# Patient Record
Sex: Male | Born: 1935 | Race: White | Hispanic: No | Smoking: Former smoker
Health system: Southern US, Community
[De-identification: ages and names within clinical notes are randomized; demographics above are authoritative.]

## PROBLEM LIST (undated history)

## (undated) DIAGNOSIS — I493 Ventricular premature depolarization: Secondary | ICD-10-CM

## (undated) DIAGNOSIS — I10 Essential (primary) hypertension: Secondary | ICD-10-CM

## (undated) DIAGNOSIS — E119 Type 2 diabetes mellitus without complications: Secondary | ICD-10-CM

## (undated) DIAGNOSIS — M1990 Unspecified osteoarthritis, unspecified site: Secondary | ICD-10-CM

## (undated) DIAGNOSIS — D649 Anemia, unspecified: Secondary | ICD-10-CM

## (undated) DIAGNOSIS — H9190 Unspecified hearing loss, unspecified ear: Secondary | ICD-10-CM

## (undated) DIAGNOSIS — I351 Nonrheumatic aortic (valve) insufficiency: Secondary | ICD-10-CM

## (undated) HISTORY — PX: BACK SURGERY: SHX140

## (undated) HISTORY — PX: NO PAST SURGERIES: SHX2092

## (undated) HISTORY — PX: COLONOSCOPY: SHX174

## (undated) HISTORY — DX: Nonrheumatic aortic (valve) insufficiency: I35.1

## (undated) HISTORY — PX: CATARACT EXTRACTION W/ INTRAOCULAR LENS IMPLANT: SHX1309

## (undated) HISTORY — DX: Ventricular premature depolarization: I49.3

---

## 1997-12-23 ENCOUNTER — Other Ambulatory Visit: Admission: RE | Admit: 1997-12-23 | Discharge: 1997-12-23 | Payer: Self-pay | Admitting: *Deleted

## 2000-03-18 ENCOUNTER — Encounter (INDEPENDENT_AMBULATORY_CARE_PROVIDER_SITE_OTHER): Payer: Self-pay | Admitting: Specialist

## 2000-03-18 ENCOUNTER — Other Ambulatory Visit: Admission: RE | Admit: 2000-03-18 | Discharge: 2000-03-18 | Payer: Self-pay | Admitting: Gastroenterology

## 2005-05-31 ENCOUNTER — Encounter: Admission: RE | Admit: 2005-05-31 | Discharge: 2005-05-31 | Payer: Self-pay | Admitting: Orthopedic Surgery

## 2005-06-19 ENCOUNTER — Encounter: Admission: RE | Admit: 2005-06-19 | Discharge: 2005-06-19 | Payer: Self-pay | Admitting: Orthopedic Surgery

## 2005-07-10 ENCOUNTER — Encounter: Admission: RE | Admit: 2005-07-10 | Discharge: 2005-07-10 | Payer: Self-pay | Admitting: Neurosurgery

## 2006-04-16 IMAGING — CT CT L SPINE W/ CM
2 of 8 series · 12 of 27 positions shown, 14 images · IV contrast (omnipaque)
Comparison: none

CLINICAL DATA: Low back and left thigh pain. Previous lumbar surgery 5440.

LUMBAR MYELOGRAM:
CT LUMBAR SPINE WITH INTRATHECAL CONTRAST:
TECHNIQUE: An appropriate entry site was determined under fluoroscopy. Skin site
was marked, prepped with Betadine, and draped in usual sterile fashion, and
infiltrated locally with 1% lidocaine. A 22 gauge spinal needle was  advanced
into the thecal sac at L3-L4 from a right interlaminar approach . Clear
colorless CSF returned. 17 ml Omnipaque 180 were administered intrathecally for
lumbar myelography, followed by axial CT scanning of the lumbar spine. Coronal
and sagittal reconstructions were generated from the axial scan.

[Series 4: recon 3: l-spine helical · axial · 0.27mm/px · z∈[-77,+67]mm · 7 of 155 slices shown, 9 images]
[im 20/155  soft-tissue]
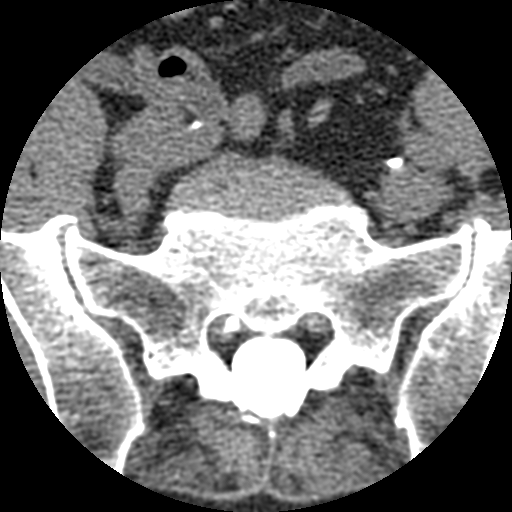
[im 20/155  bone]
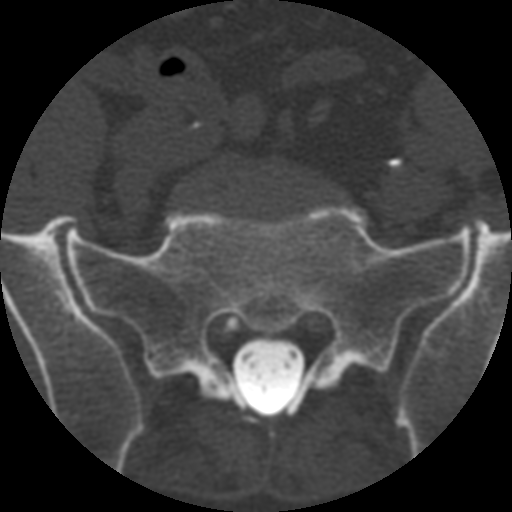
[im 39/155  bone]
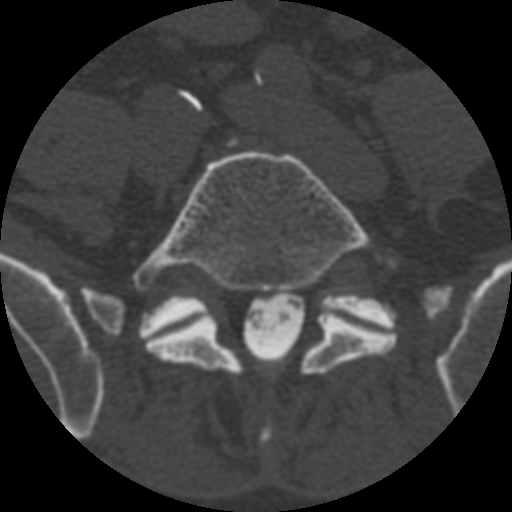
[im 58/155  bone]
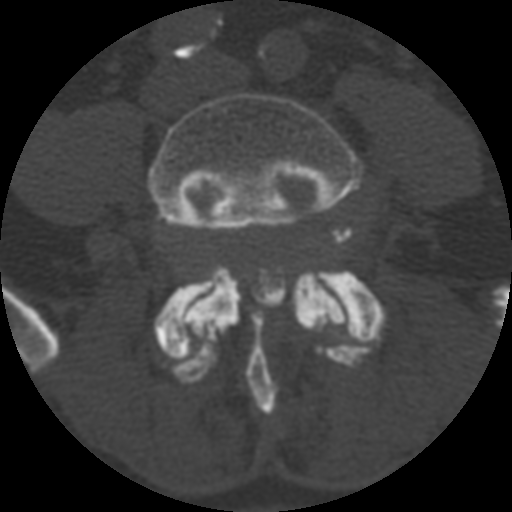
[im 78/155  bone]
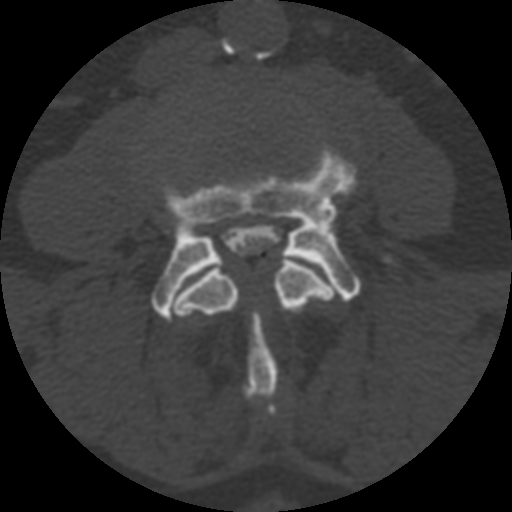
[im 97/155  soft-tissue]
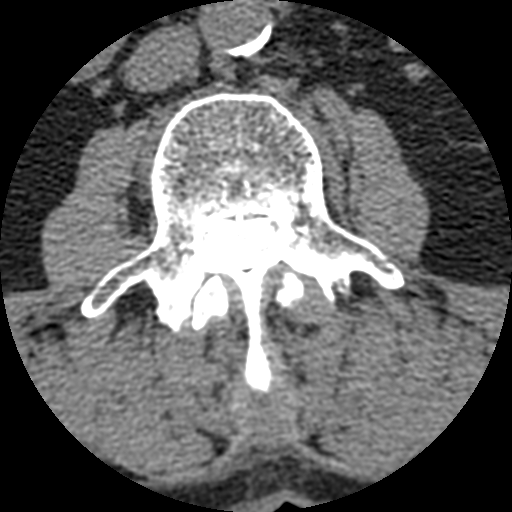
[im 97/155  bone]
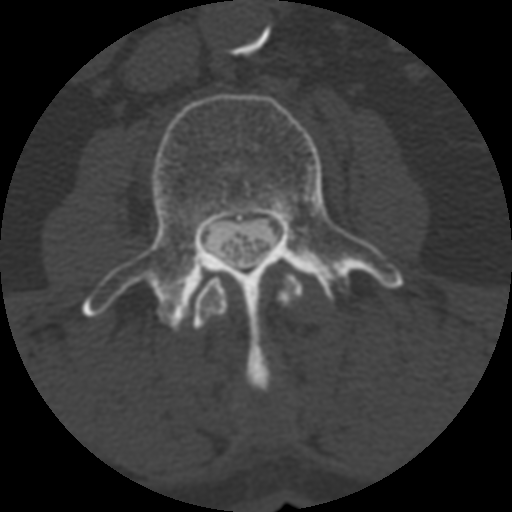
[im 116/155  bone]
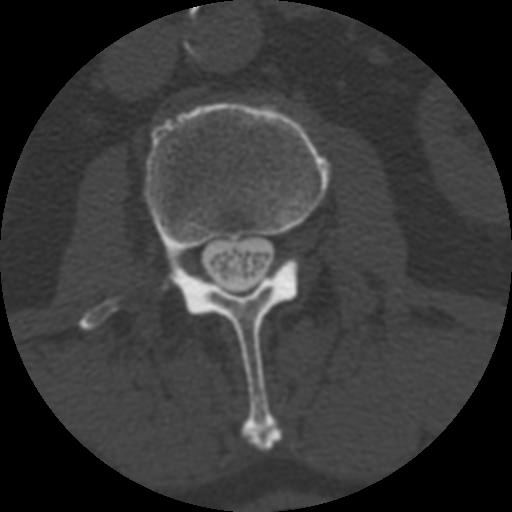
[im 135/155  bone]
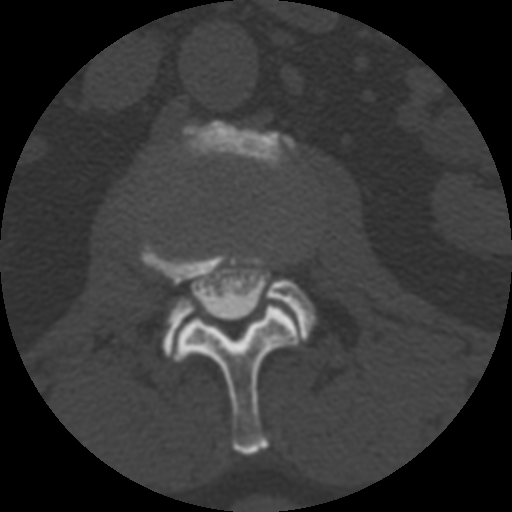

[Series 400: reformatted · sagittal · 0.39mm/px · 5 of 40 slices shown]
[im 7/40  bone]
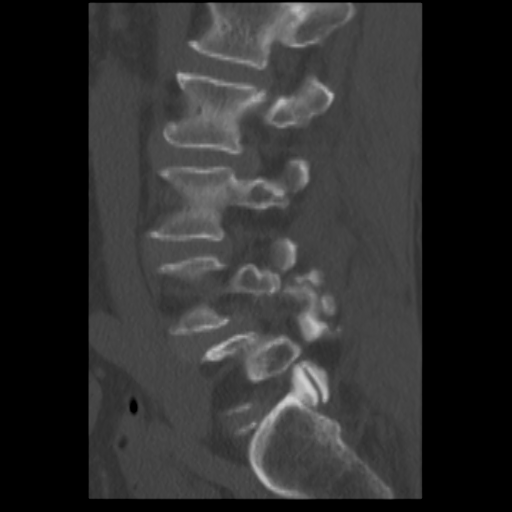
[im 14/40  bone]
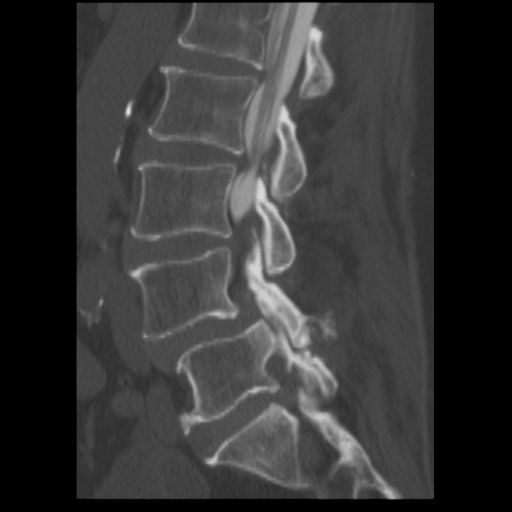
[im 20/40  bone]
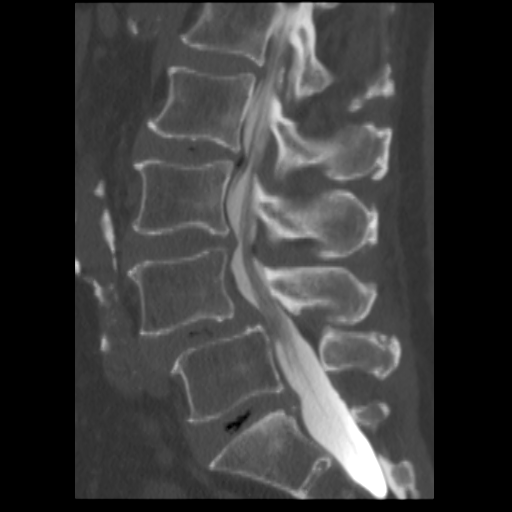
[im 27/40  bone]
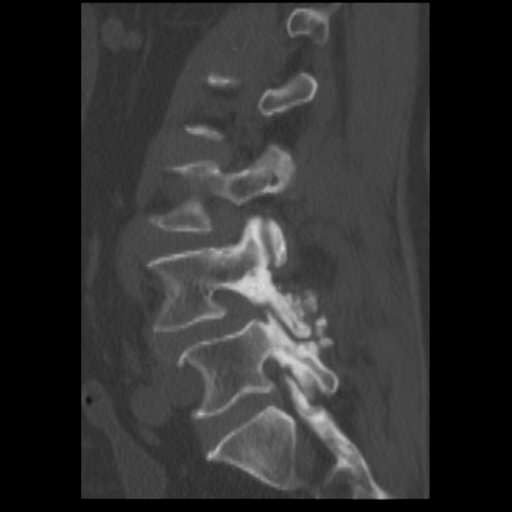
[im 33/40  bone]
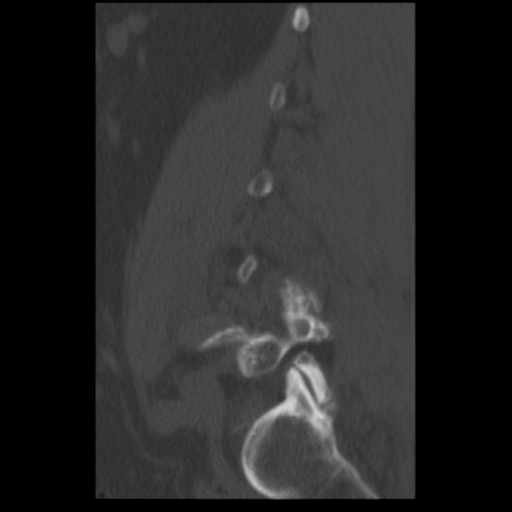

[12 of 27 positions shown; findings below may reference images not displayed]

No previous available for comparison.

L1-L2: Mild circumferential disc bulge minimally indenting the anterior aspect
of the thecal sac. Neuroforamina remain widely patent. Minimal bilateral facet
degenerative change.

L2-L3: Mild narrowed interspace with vacuum phenomenon. There is a broad
posterior disc bulge, containing small gas bubble just to the left of midline.
There  is a prominent right extra foraminal protrusion. There is mild bilateral
facet degenerative hypertrophy and some early thickening of ligamentum flavum
contributing to mild spinal stenosis.

L3-L4: Early vacuum phenomenon in the interspace. Moderate circumferential disc
bulge. Bilateral facet   mild degenerative hypertrophy with some thickening of
ligamentum flavum contributing to mild spinal stenosis and bilateral
subarticular recess stenosis. There is a left foraminal protrusion which extends
cephalad behind the L3 vertebral body. This effaces the neural foramina.

L4-L5: Advanced bilateral facet degenerative hypertrophy. This allows grade 2
anterolisthesis. Soft tissue attenuation material extends from the level of the
interspace into the foramina bilaterally, left greater than right. There is
marked thickening of the ligamentum flavum contributing to moderate spinal
stenosis. There is significant subarticular recess encroachment.

L5-S1: There is central vacuum phenomenon. There is a partially calcified right
paracentral broad protrusion which contacts the right S1 nerve root sleeve
proximally. No significant spinal stenosis. Minimal bilateral facet degenerative
hypertrophy.

Standing flexion and extension radiographs show no dynamic instability.
IMPRESSION: 1. Disc bulge L2-L3 with superimposed right extra foraminal protrusion.
2. Left foraminal protrusion   L3-4 which may affect the exiting L3 nerve root.
3. Grade 2 anterolisthesis L4-L5 secondary to facet disease, with resultant
moderate spinal stenosis, bilateral neural foraminal narrowing, and subarticular
recess encroachment.
4. Right paracentral protrusion L5-S1

## 2008-08-26 ENCOUNTER — Encounter: Admission: RE | Admit: 2008-08-26 | Discharge: 2008-08-26 | Payer: Self-pay | Admitting: Internal Medicine

## 2008-12-06 ENCOUNTER — Encounter: Admission: RE | Admit: 2008-12-06 | Discharge: 2008-12-06 | Payer: Self-pay | Admitting: Internal Medicine

## 2010-07-22 ENCOUNTER — Encounter: Admission: RE | Admit: 2010-07-22 | Discharge: 2010-07-22 | Payer: Self-pay | Admitting: Orthopedic Surgery

## 2010-08-15 ENCOUNTER — Inpatient Hospital Stay (HOSPITAL_COMMUNITY)
Admission: RE | Admit: 2010-08-15 | Discharge: 2010-08-16 | Payer: Self-pay | Source: Home / Self Care | Attending: Neurosurgery | Admitting: Neurosurgery

## 2010-08-16 ENCOUNTER — Inpatient Hospital Stay (HOSPITAL_COMMUNITY)
Admission: EM | Admit: 2010-08-16 | Discharge: 2010-08-19 | Payer: Self-pay | Source: Home / Self Care | Attending: Neurosurgery | Admitting: Neurosurgery

## 2010-09-04 ENCOUNTER — Encounter
Admission: RE | Admit: 2010-09-04 | Discharge: 2010-09-04 | Payer: Self-pay | Source: Home / Self Care | Attending: Neurosurgery | Admitting: Neurosurgery

## 2010-09-16 ENCOUNTER — Encounter: Payer: Self-pay | Admitting: Orthopedic Surgery

## 2010-11-04 NOTE — Discharge Summary (Signed)
  NAMESAMANYU, [name]                  ACCOUNT NO.:  [id number]  MEDICAL RECORD NO.:  [id number]          PATIENT TYPE:  INP  LOCATION:  3536                         FACILITY:  [facility]  PHYSICIAN:  Reinaldo Meeker, M.D. DATE OF BIRTH:  Jan 23, 1931  DATE OF ADMISSION:  08/16/2010 DATE OF DISCHARGE:  08/19/2010                              DISCHARGE SUMMARY   PRIMARY DIAGNOSES:  Status post anterior cervical discectomy with fusion and plating with postoperative seroma.  PRIMARY OPERATIVE PROCEDURE:  Anterior ablation of wound.  HISTORY:  [name] is a 77 year old gentleman who underwent a C3-C4 and C6-7 anterior cervical diskectomy few days prior to admission.  He was discharged home.  On the day re-admission, he came back with some difficulty swallowing.  Dr. Yetta Barre was attending to him that evening, chose to take him to the operating room, opened up the C3-C4 incision and evacuated, which was felt to be hematoma.  He did not actually find much hematoma but found more of a fluid collection consistent with seroma.  He, therefore, evacuated that, left a drain in place.  Over the subsequent days, the patient was able to increase his activity. Swelling is much better.  His voice is much better.  His wound looked good.  The patient, therefore, was kept until December 24.  We elected to remove the drain at that time and have him return to the office for followup in a few days.  His condition was markedly improved versus admission.          ______________________________ Reinaldo Meeker, M.D.     ROK/MEDQ  D:  10/15/2010  T:  10/16/2010  Job:  [id number]  Electronically Signed by Aliene Beams M.D. on 11/04/2010 10:21:51 AM

## 2010-11-06 LAB — DIFFERENTIAL
Basophils Absolute: 0 10*3/uL (ref 0.0–0.1)
Basophils Relative: 0 % (ref 0–1)
Eosinophils Absolute: 0 10*3/uL (ref 0.0–0.7)
Eosinophils Relative: 0 % (ref 0–5)
Lymphocytes Relative: 14 % (ref 12–46)
Lymphs Abs: 1.5 10*3/uL (ref 0.7–4.0)
Monocytes Absolute: 1.2 10*3/uL — ABNORMAL HIGH (ref 0.1–1.0)
Monocytes Relative: 11 % (ref 3–12)
Neutro Abs: 8.1 10*3/uL — ABNORMAL HIGH (ref 1.7–7.7)
Neutrophils Relative %: 75 % (ref 43–77)

## 2010-11-06 LAB — CBC
HCT: 44.4 % (ref 39.0–52.0)
HCT: 47.3 % (ref 39.0–52.0)
Hemoglobin: 14.9 g/dL (ref 13.0–17.0)
Hemoglobin: 15.5 g/dL (ref 13.0–17.0)
MCH: 31 pg (ref 26.0–34.0)
MCH: 31.1 pg (ref 26.0–34.0)
MCHC: 32.8 g/dL (ref 30.0–36.0)
MCHC: 33.6 g/dL (ref 30.0–36.0)
MCV: 92.5 fL (ref 78.0–100.0)
MCV: 94.8 fL (ref 78.0–100.0)
Platelets: 170 10*3/uL (ref 150–400)
Platelets: 171 10*3/uL (ref 150–400)
RBC: 4.8 MIL/uL (ref 4.22–5.81)
RBC: 4.99 MIL/uL (ref 4.22–5.81)
RDW: 13.5 % (ref 11.5–15.5)
RDW: 13.7 % (ref 11.5–15.5)
WBC: 10.8 10*3/uL — ABNORMAL HIGH (ref 4.0–10.5)
WBC: 5.6 10*3/uL (ref 4.0–10.5)

## 2010-11-06 LAB — GLUCOSE, CAPILLARY
Glucose-Capillary: 119 mg/dL — ABNORMAL HIGH (ref 70–99)
Glucose-Capillary: 119 mg/dL — ABNORMAL HIGH (ref 70–99)
Glucose-Capillary: 126 mg/dL — ABNORMAL HIGH (ref 70–99)
Glucose-Capillary: 139 mg/dL — ABNORMAL HIGH (ref 70–99)
Glucose-Capillary: 140 mg/dL — ABNORMAL HIGH (ref 70–99)
Glucose-Capillary: 147 mg/dL — ABNORMAL HIGH (ref 70–99)
Glucose-Capillary: 147 mg/dL — ABNORMAL HIGH (ref 70–99)
Glucose-Capillary: 149 mg/dL — ABNORMAL HIGH (ref 70–99)
Glucose-Capillary: 168 mg/dL — ABNORMAL HIGH (ref 70–99)
Glucose-Capillary: 182 mg/dL — ABNORMAL HIGH (ref 70–99)
Glucose-Capillary: 183 mg/dL — ABNORMAL HIGH (ref 70–99)
Glucose-Capillary: 199 mg/dL — ABNORMAL HIGH (ref 70–99)
Glucose-Capillary: 224 mg/dL — ABNORMAL HIGH (ref 70–99)

## 2010-11-06 LAB — BASIC METABOLIC PANEL
BUN: 16 mg/dL (ref 6–23)
CO2: 31 mEq/L (ref 19–32)
Calcium: 9.9 mg/dL (ref 8.4–10.5)
Chloride: 104 mEq/L (ref 96–112)
Creatinine, Ser: 0.8 mg/dL (ref 0.4–1.5)
GFR calc Af Amer: >60 mL/min (ref >60)
GFR calc non Af Amer: >60 mL/min (ref >60)
Glucose, Bld: 182 mg/dL — ABNORMAL HIGH (ref 70–99)
Potassium: 4.3 mEq/L (ref 3.5–5.1)
Sodium: 139 mEq/L (ref 135–145)

## 2010-11-06 LAB — POCT I-STAT, CHEM 8
Calcium, Ion: 1.06 mmol/L — ABNORMAL LOW (ref 1.12–1.32)
Chloride: 103 mEq/L (ref 96–112)
HCT: 45 % (ref 39.0–52.0)
Hemoglobin: 15.3 g/dL (ref 13.0–17.0)
Potassium: 3.7 mEq/L (ref 3.5–5.1)

## 2010-11-06 LAB — PROTIME-INR
INR: 1 (ref 0.00–1.49)
Prothrombin Time: 13.4 seconds (ref 11.6–15.2)

## 2010-11-06 LAB — SURGICAL PCR SCREEN
MRSA, PCR: NEGATIVE
Staphylococcus aureus: NEGATIVE

## 2011-04-23 ENCOUNTER — Other Ambulatory Visit: Payer: Self-pay | Admitting: Neurosurgery

## 2011-04-23 DIAGNOSIS — M4310 Spondylolisthesis, site unspecified: Secondary | ICD-10-CM

## 2011-04-26 ENCOUNTER — Ambulatory Visit
Admission: RE | Admit: 2011-04-26 | Discharge: 2011-04-26 | Disposition: A | Discharge status: Home / Self Care | Payer: Medicare Other | Source: Ambulatory Visit | Attending: Neurosurgery | Admitting: Neurosurgery

## 2011-04-26 DIAGNOSIS — M4310 Spondylolisthesis, site unspecified: Secondary | ICD-10-CM

## 2011-05-18 ENCOUNTER — Encounter (HOSPITAL_COMMUNITY)
Admission: RE | Admit: 2011-05-18 | Discharge: 2011-05-18 | Disposition: A | Discharge status: Home / Self Care | Payer: Medicare Other | Source: Ambulatory Visit | Attending: Neurosurgery | Admitting: Neurosurgery

## 2011-05-18 LAB — BASIC METABOLIC PANEL
Calcium: 9.6 mg/dL (ref 8.4–10.5)
GFR calc Af Amer: >60 mL/min (ref >60)
GFR calc non Af Amer: >60 mL/min (ref >60)
Glucose, Bld: 115 mg/dL — ABNORMAL HIGH (ref 70–99)
Potassium: 4.1 mEq/L (ref 3.5–5.1)
Sodium: 138 mEq/L (ref 135–145)

## 2011-05-18 LAB — TYPE AND SCREEN
ABO/RH(D): A POS
Antibody Screen: NEGATIVE

## 2011-05-18 LAB — CBC
Hemoglobin: 15.2 g/dL (ref 13.0–17.0)
Platelets: 160 10*3/uL (ref 150–400)
RBC: 4.88 MIL/uL (ref 4.22–5.81)
WBC: 4.5 10*3/uL (ref 4.0–10.5)

## 2011-05-18 LAB — SURGICAL PCR SCREEN: Staphylococcus aureus: NEGATIVE

## 2011-05-18 LAB — ABO/RH: ABO/RH(D): A POS

## 2011-05-21 ENCOUNTER — Inpatient Hospital Stay (HOSPITAL_COMMUNITY)
Admission: RE | Admit: 2011-05-21 | Discharge: 2011-05-23 | DRG: 460 | Disposition: A | Discharge status: Home / Self Care | Payer: Medicare Other | Source: Ambulatory Visit | Attending: Neurosurgery | Admitting: Neurosurgery

## 2011-05-21 ENCOUNTER — Inpatient Hospital Stay (HOSPITAL_COMMUNITY): Payer: Medicare Other

## 2011-05-21 DIAGNOSIS — Z01812 Encounter for preprocedural laboratory examination: Secondary | ICD-10-CM

## 2011-05-21 DIAGNOSIS — E119 Type 2 diabetes mellitus without complications: Secondary | ICD-10-CM | POA: Diagnosis present

## 2011-05-21 DIAGNOSIS — M48062 Spinal stenosis, lumbar region with neurogenic claudication: Principal | ICD-10-CM | POA: Diagnosis present

## 2011-05-21 DIAGNOSIS — Z87891 Personal history of nicotine dependence: Secondary | ICD-10-CM

## 2011-05-21 DIAGNOSIS — Q762 Congenital spondylolisthesis: Secondary | ICD-10-CM

## 2011-05-21 DIAGNOSIS — I10 Essential (primary) hypertension: Secondary | ICD-10-CM | POA: Diagnosis present

## 2011-05-21 LAB — GLUCOSE, CAPILLARY: Glucose-Capillary: 114 mg/dL — ABNORMAL HIGH (ref 70–99)

## 2011-05-22 LAB — BASIC METABOLIC PANEL
BUN: 13 mg/dL (ref 6–23)
CO2: 34 mEq/L — ABNORMAL HIGH (ref 19–32)
Calcium: 8.8 mg/dL (ref 8.4–10.5)
Chloride: 102 mEq/L (ref 96–112)
Creatinine, Ser: 0.67 mg/dL (ref 0.50–1.35)
Glucose, Bld: 122 mg/dL — ABNORMAL HIGH (ref 70–99)

## 2011-05-22 LAB — CBC
Hemoglobin: 13.1 g/dL (ref 13.0–17.0)
MCH: 30.8 pg (ref 26.0–34.0)
MCHC: 33 g/dL (ref 30.0–36.0)
MCV: 93.4 fL (ref 78.0–100.0)
RBC: 4.25 MIL/uL (ref 4.22–5.81)

## 2011-05-23 LAB — GLUCOSE, CAPILLARY
Glucose-Capillary: 125 mg/dL — ABNORMAL HIGH (ref 70–99)
Glucose-Capillary: 132 mg/dL — ABNORMAL HIGH (ref 70–99)
Glucose-Capillary: 122 mg/dL — ABNORMAL HIGH (ref 70–99)

## 2011-05-24 ENCOUNTER — Inpatient Hospital Stay (HOSPITAL_COMMUNITY)
Admission: EM | Admit: 2011-05-24 | Discharge: 2011-05-31 | DRG: 603 | Disposition: A | Discharge status: Skilled Nursing Facility | Payer: Medicare Other | Attending: Neurosurgery | Admitting: Neurosurgery

## 2011-05-24 ENCOUNTER — Emergency Department (HOSPITAL_COMMUNITY): Payer: Medicare Other

## 2011-05-24 DIAGNOSIS — J9819 Other pulmonary collapse: Secondary | ICD-10-CM | POA: Diagnosis present

## 2011-05-24 DIAGNOSIS — Z23 Encounter for immunization: Secondary | ICD-10-CM

## 2011-05-24 DIAGNOSIS — N390 Urinary tract infection, site not specified: Secondary | ICD-10-CM | POA: Diagnosis present

## 2011-05-24 DIAGNOSIS — T361X5A Adverse effect of cephalosporins and other beta-lactam antibiotics, initial encounter: Secondary | ICD-10-CM | POA: Diagnosis present

## 2011-05-24 DIAGNOSIS — Z87891 Personal history of nicotine dependence: Secondary | ICD-10-CM

## 2011-05-24 DIAGNOSIS — R21 Rash and other nonspecific skin eruption: Secondary | ICD-10-CM | POA: Diagnosis present

## 2011-05-24 DIAGNOSIS — I10 Essential (primary) hypertension: Secondary | ICD-10-CM | POA: Diagnosis present

## 2011-05-24 DIAGNOSIS — Z79899 Other long term (current) drug therapy: Secondary | ICD-10-CM

## 2011-05-24 DIAGNOSIS — R339 Retention of urine, unspecified: Secondary | ICD-10-CM | POA: Diagnosis present

## 2011-05-24 DIAGNOSIS — E119 Type 2 diabetes mellitus without complications: Secondary | ICD-10-CM | POA: Diagnosis present

## 2011-05-24 DIAGNOSIS — Z981 Arthrodesis status: Secondary | ICD-10-CM

## 2011-05-24 DIAGNOSIS — L02219 Cutaneous abscess of trunk, unspecified: Principal | ICD-10-CM | POA: Diagnosis present

## 2011-05-24 DIAGNOSIS — Z7982 Long term (current) use of aspirin: Secondary | ICD-10-CM

## 2011-05-24 LAB — URINALYSIS, ROUTINE W REFLEX MICROSCOPIC
Leukocytes, UA: NEGATIVE
Nitrite: NEGATIVE
Protein, ur: 30 mg/dL — AB
Specific Gravity, Urine: 1.015 (ref 1.005–1.030)
Urobilinogen, UA: 0.2 mg/dL (ref 0.0–1.0)

## 2011-05-24 LAB — COMPREHENSIVE METABOLIC PANEL
Alkaline Phosphatase: 47 U/L (ref 39–117)
BUN: 16 mg/dL (ref 6–23)
Chloride: 90 mEq/L — ABNORMAL LOW (ref 96–112)
GFR calc Af Amer: >60 mL/min (ref >60)
Glucose, Bld: 129 mg/dL — ABNORMAL HIGH (ref 70–99)
Potassium: 4.1 mEq/L (ref 3.5–5.1)
Total Bilirubin: 2.1 mg/dL — ABNORMAL HIGH (ref 0.3–1.2)
Total Protein: 6.6 g/dL (ref 6.0–8.3)

## 2011-05-24 LAB — CBC
HCT: 38.4 % — ABNORMAL LOW (ref 39.0–52.0)
MCH: 30.8 pg (ref 26.0–34.0)
MCV: 90.1 fL (ref 78.0–100.0)
RDW: 13.4 % (ref 11.5–15.5)
WBC: 7.8 10*3/uL (ref 4.0–10.5)

## 2011-05-24 LAB — DIFFERENTIAL
Eosinophils Relative: 0 % (ref 0–5)
Lymphocytes Relative: 5 % — ABNORMAL LOW (ref 12–46)
Lymphs Abs: 0.4 10*3/uL — ABNORMAL LOW (ref 0.7–4.0)
Monocytes Relative: 3 % (ref 3–12)

## 2011-05-24 LAB — URINE MICROSCOPIC-ADD ON

## 2011-05-24 NOTE — Op Note (Signed)
NAMEDUY, LEMMING NO.:  [id number]  MEDICAL RECORD NO.:  [id number]  LOCATION:  3040                         FACILITY:  [facility]  PHYSICIAN:  Cristi Loron, M.D.DATE OF BIRTH:  [birth year]  DATE OF PROCEDURE:  05/21/2011 DATE OF DISCHARGE:                              OPERATIVE REPORT   BRIEF HISTORY:  The patient is a 77 year old white male who has undergone a previous laminectomy by another physician years ago.  The patient has developed progressive back, hip, and leg pain consistent with neurogenic claudication.  He has failed medical management and worked up with a lumbar MRI, which demonstrated the patient had severe spinal stenosis at L2-3, L3-4, L4-5 with a grade 2 spondylolisthesis at L4-5.  I discussed the various treatment options with the patient including surgery.  The patient has weighed the risks, benefits, and alternatives of surgery, and decided to proceed with the lumbar laminectomy, decompression, and fusion.  PREOPERATIVE DIAGNOSES:  L2-3, L3-4, L4-5 spinal stenosis, facet arthropathy, lumbago, lumbar radiculopathy, L4-5 spondylolisthesis.  POSTOPERATIVE DIAGNOSES:  L2-3, L3-4, L4-5 spinal stenosis, facet arthropathy, lumbago, lumbar radiculopathy, L4-5 spondylolisthesis.  PROCEDURE:  Redo laminectomy at L3 and L4 with bilateral L2 laminotomies to decompress the bilateral L2, L3, L4, and L5 nerve roots (work required to do this was in addition to work required due to posterior lumbar interbody fusion because of the patient's severe stenosis and facet arthropathy requiring a wide lateral decompression); L4-5 posterior lumbar interbody fusion with local morselized autograft bone; and Actifuse bone graft extender; insertion of interbody prosthesis (Capstone PEEK interbody prosthesis); L4-5 posterior nonsegmental instrumentation with Legacy titanium pedicle screws and rods; L4-5 posterolateral arthrodesis with local morselized autograft  bone and Vitoss bone graft extender.  SURGEON:  Cristi Loron, MD  ASSISTANT:  Danae Orleans. Venetia Maxon, MD  ANESTHESIA:  General endotracheal.  ESTIMATED BLOOD LOSS:  300 mL.  SPECIMENS:  None.  DRAINS:  None.  COMPLICATIONS:  None.  DESCRIPTION OF PROCEDURE:  The patient was brought to the operating room by anesthesia team.  General endotracheal anesthesia was induced.  The patient was turned to the prone position on the Wilson frame.  His lumbosacral region was then prepared with Betadine scrub and Betadine solution.  Sterile drapes were applied.  I then injected the area to be incised with Marcaine with epinephrine solution.  I then used a scalpel to make a linear midline incision over the L2-3, L3-4, and L4-5 interspaces incising through the patient's previous surgical scar.  I used electrocautery to perform a bilateral subperiosteal dissection exposing the spinous process lamina of L2, L3, L4, and L5.  We obtained intraoperative radiograph to confirm our location.  I then inserted a bursa tract retractor for exposure. We began the decompression by incising the L2-3, L3-4, and L4-5 interspinous ligament with a scalpel.  I used the Leksell rongeur to remove the L3 and L4 spinous process as well as the caudal aspect of the L2 spinous process.  I then used a high-speed drill to perform bilateral L2, L3, and L4 laminotomies.  Of note, we did encounter quite a bit of epidural scar tissue at L4-5 that was expected from the  patient's previous operation.  I then completed the L3 and L4 laminectomy and widened the L2 laminotomies with Kerrison punch.  We removed the ligamentum flavum at L2-3, L3-4, and L4-5.  There was quite a bit of stenosis, and we noted severe facet arthropathy particularly at L4-5. We decompressed the thecal sac and performed wide foraminotomies about the bilateral L2, L3, L4, and L5 nerve roots completing the decompression.  We now turned attention to the  posterior lumbar interbody fusion.  I incised the L4-5 intervertebral disk with a 15 blade scalpel and performed a partial intervertebral diskectomy with the pituitary forceps.  We prepared the vertebral endplates using the curettes to clear the remainder of the intervertebral disk from the L4-5 vertebral endplates.  We then used a trial spacers and determined to use a 12 x 22 mm PEEK interbody prosthesis bilaterally.  We prefilled prosthesis with a combination of local autograft bone.  We obtained decompression as well as Actifuse bone graft extender.  We inserted the prosthesis at the L4-5 interspace of course after retracting the neural structures out of harm's way.  There was good snug fit of prosthesis bilaterally.  We filled the remainder of the disk space with local autograft bone and Actifuse and local bone. We removed the ball probe and then tapped the pedicle with a 6.5-mm tap.  With the tap in, probed inside the tapped pedicles to rule out cortical breeches.  We then inserted 7.5 x 0.55 mm pedicle screws bilaterally into the L4 and L5 pedicles under fluoroscopic guidance.  We got good bony purchase.  We then connected the unilateral pedicle screws with lordotic rod.  We compressed the construct and secured the rod in place with the caps, which completed the instrumentation.  We now turned our attention to the posterolateral arthrodesis.  We used high-speed drill to decorticate the remainder of the facets, pars, transverse process, etc., at L4 and L5, made a combination of local autograft bone and Vitoss bone graft extender over these decorticated posterolateral structures.  This completed the posterolateral arthrodesis.  We then obtained hemostasis using bipolar electrocautery.  We irrigated the wound out with bacitracin solution.  We then inspected the thecal sac at L2-3, L3-4, and L4-5 as well as bilateral L2, L3, L4, and L5 nerve roots, and noted the neural structures  well decompressed.  We then removed the retractor and then reapproximated the patient's thoracolumbar fascia with interrupted #1 Vicryl suture, subcutaneous tissue with interrupted 2-0 Vicryl suture, and skin with Steri-Strips and Benzoin.  The wound was then coated with bacitracin ointment. Sterile dressing was applied, drapes were removed, and the patient was subsequently returned to supine position where he was extubated by the Anesthesia Team and transported to the postanesthesia care unit in stable condition.  All sponge, instrument, and needle counts were correct at the end of this case.     Cristi Loron, M.D.    JDJ/MEDQ  D:  05/21/2011  T:  05/22/2011  Job:  [id number]  Electronically Signed by Tressie Stalker M.D. on 05/24/2011 09:45:01 AM

## 2011-05-25 DIAGNOSIS — J189 Pneumonia, unspecified organism: Secondary | ICD-10-CM

## 2011-05-25 LAB — GLUCOSE, CAPILLARY
Glucose-Capillary: 126 mg/dL — ABNORMAL HIGH (ref 70–99)
Glucose-Capillary: 135 mg/dL — ABNORMAL HIGH (ref 70–99)

## 2011-05-26 LAB — CBC
MCV: 93.4 fL (ref 78.0–100.0)
Platelets: 190 10*3/uL (ref 150–400)
RBC: 3.8 MIL/uL — ABNORMAL LOW (ref 4.22–5.81)
WBC: 5.2 10*3/uL (ref 4.0–10.5)

## 2011-05-26 LAB — GLUCOSE, CAPILLARY

## 2011-05-26 LAB — DIFFERENTIAL
Basophils Relative: 2 % — ABNORMAL HIGH (ref 0–1)
Eosinophils Absolute: 0.3 10*3/uL (ref 0.0–0.7)
Eosinophils Relative: 5 % (ref 0–5)
Lymphocytes Relative: 24 % (ref 12–46)
Monocytes Relative: 9 % (ref 3–12)
Neutrophils Relative %: 60 % (ref 43–77)

## 2011-05-27 LAB — URINE CULTURE
Colony Count: >=100000
Culture  Setup Time: 201209271601

## 2011-05-27 LAB — GLUCOSE, CAPILLARY

## 2011-05-28 DIAGNOSIS — M47817 Spondylosis without myelopathy or radiculopathy, lumbosacral region: Secondary | ICD-10-CM

## 2011-05-28 DIAGNOSIS — IMO0002 Reserved for concepts with insufficient information to code with codable children: Secondary | ICD-10-CM

## 2011-05-28 LAB — GLUCOSE, CAPILLARY
Glucose-Capillary: 128 mg/dL — ABNORMAL HIGH (ref 70–99)
Glucose-Capillary: 112 mg/dL — ABNORMAL HIGH (ref 70–99)

## 2011-05-29 LAB — GLUCOSE, CAPILLARY: Glucose-Capillary: 130 mg/dL — ABNORMAL HIGH (ref 70–99)

## 2011-05-30 LAB — URINALYSIS, ROUTINE W REFLEX MICROSCOPIC
Bilirubin Urine: NEGATIVE
Glucose, UA: NEGATIVE mg/dL
Ketones, ur: NEGATIVE mg/dL
Leukocytes, UA: NEGATIVE
pH: 5.5 (ref 5.0–8.0)

## 2011-05-30 LAB — GLUCOSE, CAPILLARY: Glucose-Capillary: 141 mg/dL — ABNORMAL HIGH (ref 70–99)

## 2011-05-30 LAB — CULTURE, BLOOD (ROUTINE X 2)
Culture  Setup Time: 201209272145
Culture: NO GROWTH

## 2011-05-30 NOTE — Consult Note (Signed)
NAMEBERTHA, EARWOOD NO.:  [id number]  MEDICAL RECORD NO.:  [id number]  LOCATION:  3036                         FACILITY:  [facility]  PHYSICIAN:  Gardiner Barefoot, MD    DATE OF BIRTH:  06/16/1931  DATE OF CONSULTATION: DATE OF DISCHARGE:                                CONSULTATION   CONSULTING PHYSICIAN:  Cristi Loron, MD  REASON FOR CONSULTATION:  Fever and concern for cellulitis and pneumonia.  HISTORY OF PRESENT ILLNESS:  This is a 77 year old male who had the day before admission being discharged after having laminectomy on his lumbar region on September 25 and was doing well at that time.  However, after being home for night, he did get up at home and he describes his legs giving out and falling.  He did not have any loss of consciousness and he did remember the event, it was not a syncopal-type episode but a mechanical-type fall.  He denies having any fever or chills or symptoms of infection at home.  He does describe some urinary retention as well, but otherwise denies any increased cough or significant sputum production, no shortness of breath.  He denies any chills or subjective feeling of fever.  His wife did call the office who had him come in to the emergency room for evaluation.  It was noted in the emergency room initially that his temperature was 102.8 though the patient tells me that at that time he did not actually feel febrile in any way and gain was not having any symptoms consistent with chills or anything else that he had noted.  Workup in the emergency room was notable for a normal WBC at 7.8, normal lactic acid at 2.0 and a chest x-ray that showed atelectasis versus early infiltrate.  Additionally it was noted on exam that he had some erythema and ecchymosis on the area around his surgical wound though his surgical incision was without erythema.  PAST MEDICAL HISTORY: 1. Diabetes. 2. Hypertension. 3. History of cervical  rupture. 4. Lumbar spondylolisthesis  SURGICAL HISTORY:  As above, recent laminectomy and L4-L5 laminectomy in 1990, cervical diskectomy and fusion and plating in December 2011.  HOME MEDICATIONS: 1. Triamterene 37.5 mg half tablet daily. 2. Toprol 50 mg daily. 3. Actos 30 mg daily. 4. Vytorin 40/20 mg half a tablet daily. 5. Aspirin 81 mg daily.  Current antibiotics include vancomycin and Zosyn.  ALLERGIES:  No known drug allergies.  SOCIAL HISTORY:  The patient is married and his wife is at the bedside. Son is also at the bed site.  FAMILY HISTORY:  No history of immune deficiency.  REVIEW OF SYSTEMS:  A 12-point review of systems was obtained and negative except as per the history of present illness.  PHYSICAL EXAMINATION:  VITAL SIGNS:  Temperature is 98.8 and maximum temperature is 99.5 since admission, pulse is 89, respirations 17, blood pressure is 131/69, O2 sats 92% on room air, which is consistent with his previous hospitalization oxygen numbers. GENERAL:  The patient is awake, alert and oriented x3 and appears in no acute distress. HEENT:  Anicteric and no cervical lymphadenopathy. CARDIOVASCULAR:  Regular rate and rhythm.  No murmurs, rubs or gallops. LUNGS:  Clear to auscultation bilaterally. ABDOMEN:  Soft, nontender, nondistended.  Positive bowel sounds and no hepatosplenomegaly. EXTREMITIES:  No cyanosis, clubbing or edema. BACK:  Some ecchymosis and erythema and some increased warmth in the area compared to other sites.  However nontender and no significant edema.  No fluctuance.  Chest x-ray shows patchy left lower lobe segmental atelectasis versus early infiltrate.  WBCs from September 27 is 7.8 with 92% neutrophils, hemoglobin 13, platelets 157, creatinine is 0.62, ESR is 74, blood cultures x2 are no growth to date, UA shows no wbcs and 3-6 rbcs.  Urine culture is pending.  ASSESSMENT AND PLAN: 1. Pneumonia.  The patient does not have any evidence  of pneumonia     with chest x-ray that is not too impressive for pneumonia and no     associated symptoms including no hypoxia, no increased white count,     no fever since admission and otherwise no change in his cough or     other concerns for pneumonia.  His oxygen saturation which is     around the low and mid 90s is consistent with previous and he is a     previous smoker and therefore this is also not changed.  Therefore     at this time he does not have any evidence for pneumonia and his     empiric antibiotics will be stopped.  He can be observed off of     antibiotics.  Blood cultures will be watched and there are any     changes or positive blood cultures, antibiotics will be restarted     at that time. 2. Cellulitis.  There is some erythema on his lower back and some     ecchymosis.  I am not sure that this is infection.  However, I will     have him started on Keflex p.o. for presumed cellulitis.  PCR     negative for MRSAs and no indication for MRSA coverage at this     time.  I will continue to follow the patient for any further     positive cultures or changes in his clinical status.     Gardiner Barefoot, MD     RWC/MEDQ  D:  05/25/2011  T:  05/25/2011  Job:  [id number]  Electronically Signed by Staci Righter MD on 05/30/2011 03:01:06 PM

## 2011-05-31 LAB — GLUCOSE, CAPILLARY: Glucose-Capillary: 121 mg/dL — ABNORMAL HIGH (ref 70–99)

## 2011-06-01 LAB — URINE CULTURE: Culture  Setup Time: 201210031433

## 2011-06-03 NOTE — Discharge Summary (Signed)
  [name], BILLER NO.:  [id number]  MEDICAL RECORD NO.:  [id number]  LOCATION:  3036                         FACILITY:  [facility]  PHYSICIAN:  Cristi Loron, M.D.DATE OF BIRTH:  1930-11-18  DATE OF ADMISSION:  05/24/2011 DATE OF DISCHARGE:  05/31/2011                              DISCHARGE SUMMARY   BRIEF HISTORY:  The patient is a 76 year old white male who I performed a lumbar decompression and fusion on May 21, 2011.  The patient did well and subsequently discharged.  He returned with fever and some urinary retention.  There was question of pneumonia and urinary tract infection.  The patient was admitted for further management.  HOSPITAL COURSE:  The patient was admitted with a possible pneumonia and urinary tract infection.  He was treated initially with vancomycin and Zosyn, this was changed over to Keflex.  We had PT and OT to see the patient.  The patient did not feel he was able to go home, so we arranged for skilled nursing facility placement.  The patient did develop a rash well on Keflex and this was discontinued, the rash improved.  He had a repeat urinalysis, which was negative.  No more pulmonary symptoms.  By May 31, 2011, the patient was afebrile, vital signs were stable, and he was ambulating adequately, his wound was healing well and was ready for transfer to [facility].  DISCHARGE PRESCRIPTIONS:  Percocet 5/325, #100 one to two p.o. q.4 h. p.r.n. for pain.  DISCHARGE INSTRUCTIONS:  The patient was given written and oral discharge instructions.  The patient was instructed to follow up me in few weeks for routine postop check.  All the patient's questions were answered.  FINAL DIAGNOSIS:  Urinary tract infection.  PROCEDURE PERFORMED:  None.     Cristi Loron, M.D.     JDJ/MEDQ  D:  05/31/2011  T:  05/31/2011  Job:  [id number]  Electronically Signed by Tressie Stalker M.D. on  06/03/2011 05:07:28 PM

## 2011-06-03 NOTE — H&P (Signed)
  NAMEWON, KREUZER NO.:  [id number]  MEDICAL RECORD NO.:  [id number]  LOCATION:  [facility]                         FACILITY:  [facility]  PHYSICIAN:  Cristi Loron, M.D.DATE OF BIRTH:  06-25-1931  DATE OF ADMISSION:  05/24/2011 DATE OF DISCHARGE:                             HISTORY & PHYSICAL   BRIEF HISTORY:  The patient is a 77 year old white male.  I performed a L2 through L4 laminectomy with an L4-5 posterior interbody lumbar fusion, interbody prosthesis and posterior segment instrumentation with pedicle screws and rods 3 days ago.  The patient had done well postoperatively and was discharged to home yesterday.  Today, the patient began having sensation of generalized weakness, he took a fall today, and was brought to emergency department by EMS.  The patient was evaluated by Dr. Bruce Donath to include urinalysis, white count, chest x-ray, etc.  The chest x-ray demonstrated evidence of pneumonia and I am going to readmit the patient.  Presently, the patient is alert, pleasant.  His back is appropriately sore.  He has had no drainage from his wound.  The patient's past medical history, past surgical history, family history, social history, review of systems, etc., can be obtained from his prior admission notes.  PHYSICAL EXAMINATION:  GENERAL:  A pleasant 77 year old white male, in no apparent distress. VITAL SIGNS:  His temperature is 102.8. HEENT:  Normocephalic, atraumatic. NECK:  Supple.  No masses, deformities, meningismus. THORAX:  Symmetric. ABDOMEN:  Soft. EXTREMITIES:  No obvious deformities. BACK:  The patient's lumbar incision itself is healing well without signs of infection, drainage, etc.  The patient does have a rather prominent bilateral flank erythema which is warm to the touch.  He has some buttocks ecchymosis.  The patient's urinalysis by report is negative.  His white count is normal, 7.8.  The chest x-ray by report demonstrates  pneumonia.  ASSESSMENT AND PLAN: 1. Status post lumbar decompression and fusion.  Doing well from that     regard except as below. 2. Pneumonia.  The patient is going to be started on vancomycin and     Zosyn. 3. Possible cellulitis.  Infection doctors to see him. 4. Urinary retention.  The patient has had a catheter placed.     Cristi Loron, M.D.     JDJ/MEDQ  D:  05/24/2011  T:  05/24/2011  Job:  [id number]  Electronically Signed by Tressie Stalker M.D. on 06/03/2011 05:07:21 PM

## 2012-05-02 ENCOUNTER — Other Ambulatory Visit (HOSPITAL_COMMUNITY): Payer: Self-pay | Admitting: Internal Medicine

## 2012-05-02 DIAGNOSIS — R062 Wheezing: Secondary | ICD-10-CM

## 2012-05-07 ENCOUNTER — Ambulatory Visit (HOSPITAL_COMMUNITY)
Admission: RE | Admit: 2012-05-07 | Discharge: 2012-05-07 | Disposition: A | Discharge status: Home / Self Care | Payer: Medicare Other | Source: Ambulatory Visit | Attending: Internal Medicine | Admitting: Internal Medicine

## 2012-05-07 DIAGNOSIS — R062 Wheezing: Secondary | ICD-10-CM | POA: Insufficient documentation

## 2012-05-07 MED ORDER — ALBUTEROL SULFATE (5 MG/ML) 0.5% IN NEBU
2.5000 mg | INHALATION_SOLUTION | Freq: Once | RESPIRATORY_TRACT | Status: AC
  Administered 2012-05-07: 2.5 mg via RESPIRATORY_TRACT

## 2013-03-03 ENCOUNTER — Encounter (HOSPITAL_BASED_OUTPATIENT_CLINIC_OR_DEPARTMENT_OTHER): Payer: Medicare Other | Attending: General Surgery

## 2013-03-03 DIAGNOSIS — T25219A Burn of second degree of unspecified ankle, initial encounter: Secondary | ICD-10-CM | POA: Insufficient documentation

## 2013-03-03 DIAGNOSIS — T24219A Burn of second degree of unspecified thigh, initial encounter: Secondary | ICD-10-CM | POA: Insufficient documentation

## 2013-03-03 DIAGNOSIS — K219 Gastro-esophageal reflux disease without esophagitis: Secondary | ICD-10-CM | POA: Insufficient documentation

## 2013-03-03 DIAGNOSIS — E785 Hyperlipidemia, unspecified: Secondary | ICD-10-CM | POA: Insufficient documentation

## 2013-03-03 DIAGNOSIS — Z79899 Other long term (current) drug therapy: Secondary | ICD-10-CM | POA: Insufficient documentation

## 2013-03-03 DIAGNOSIS — IMO0002 Reserved for concepts with insufficient information to code with codable children: Secondary | ICD-10-CM | POA: Insufficient documentation
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
## 2013-03-31 ENCOUNTER — Encounter (HOSPITAL_BASED_OUTPATIENT_CLINIC_OR_DEPARTMENT_OTHER): Payer: Medicare Other | Attending: General Surgery

## 2013-03-31 DIAGNOSIS — T24219A Burn of second degree of unspecified thigh, initial encounter: Secondary | ICD-10-CM | POA: Insufficient documentation

## 2013-03-31 DIAGNOSIS — X088XXA Exposure to other specified smoke, fire and flames, initial encounter: Secondary | ICD-10-CM | POA: Insufficient documentation

## 2013-03-31 DIAGNOSIS — T25219A Burn of second degree of unspecified ankle, initial encounter: Secondary | ICD-10-CM | POA: Insufficient documentation

## 2013-04-28 ENCOUNTER — Encounter (HOSPITAL_BASED_OUTPATIENT_CLINIC_OR_DEPARTMENT_OTHER): Payer: Medicare Other | Attending: General Surgery

## 2013-04-28 DIAGNOSIS — T25219A Burn of second degree of unspecified ankle, initial encounter: Secondary | ICD-10-CM | POA: Insufficient documentation

## 2013-04-28 DIAGNOSIS — X088XXA Exposure to other specified smoke, fire and flames, initial encounter: Secondary | ICD-10-CM | POA: Insufficient documentation

## 2014-07-08 ENCOUNTER — Other Ambulatory Visit: Payer: Self-pay | Admitting: Internal Medicine

## 2014-07-08 DIAGNOSIS — R42 Dizziness and giddiness: Secondary | ICD-10-CM

## 2014-07-15 ENCOUNTER — Other Ambulatory Visit: Payer: Medicare Other

## 2015-11-29 ENCOUNTER — Observation Stay (HOSPITAL_COMMUNITY)
Admission: EM | Admit: 2015-11-29 | Discharge: 2015-12-02 | Disposition: A | Discharge status: Home / Self Care | Payer: Medicare Other | Attending: Internal Medicine | Admitting: Internal Medicine

## 2015-11-29 ENCOUNTER — Encounter (HOSPITAL_COMMUNITY): Payer: Self-pay

## 2015-11-29 DIAGNOSIS — I4510 Unspecified right bundle-branch block: Secondary | ICD-10-CM | POA: Diagnosis not present

## 2015-11-29 DIAGNOSIS — G459 Transient cerebral ischemic attack, unspecified: Secondary | ICD-10-CM

## 2015-11-29 DIAGNOSIS — D62 Acute posthemorrhagic anemia: Secondary | ICD-10-CM | POA: Insufficient documentation

## 2015-11-29 DIAGNOSIS — H9190 Unspecified hearing loss, unspecified ear: Secondary | ICD-10-CM | POA: Diagnosis not present

## 2015-11-29 DIAGNOSIS — Z79899 Other long term (current) drug therapy: Secondary | ICD-10-CM | POA: Diagnosis not present

## 2015-11-29 DIAGNOSIS — I10 Essential (primary) hypertension: Secondary | ICD-10-CM | POA: Diagnosis not present

## 2015-11-29 DIAGNOSIS — E119 Type 2 diabetes mellitus without complications: Secondary | ICD-10-CM | POA: Insufficient documentation

## 2015-11-29 DIAGNOSIS — R112 Nausea with vomiting, unspecified: Secondary | ICD-10-CM | POA: Diagnosis not present

## 2015-11-29 DIAGNOSIS — Z87891 Personal history of nicotine dependence: Secondary | ICD-10-CM | POA: Diagnosis not present

## 2015-11-29 DIAGNOSIS — K2211 Ulcer of esophagus with bleeding: Principal | ICD-10-CM | POA: Insufficient documentation

## 2015-11-29 DIAGNOSIS — K920 Hematemesis: Secondary | ICD-10-CM | POA: Diagnosis present

## 2015-11-29 HISTORY — DX: Type 2 diabetes mellitus without complications: E11.9

## 2015-11-29 HISTORY — DX: Unspecified hearing loss, unspecified ear: H91.90

## 2015-11-29 HISTORY — DX: Essential (primary) hypertension: I10

## 2015-11-29 LAB — CBC
HEMATOCRIT: 42.4 % (ref 39.0–52.0)
HEMOGLOBIN: 14.3 g/dL (ref 13.0–17.0)
MCH: 30.6 pg (ref 26.0–34.0)
MCHC: 33.7 g/dL (ref 30.0–36.0)
MCV: 90.6 fL (ref 78.0–100.0)
PLATELETS: 191 10*3/uL (ref 150–400)
RBC: 4.68 MIL/uL (ref 4.22–5.81)
RDW: 13 % (ref 11.5–15.5)
WBC: 12.3 10*3/uL — AB (ref 4.0–10.5)

## 2015-11-29 LAB — COMPREHENSIVE METABOLIC PANEL
ALT: 26 U/L (ref 17–63)
ANION GAP: 10 (ref 5–15)
AST: 26 U/L (ref 15–41)
Albumin: 4 g/dL (ref 3.5–5.0)
Alkaline Phosphatase: 39 U/L (ref 38–126)
BUN: 20 mg/dL (ref 6–20)
CHLORIDE: 103 mmol/L (ref 101–111)
CO2: 24 mmol/L (ref 22–32)
CREATININE: 0.72 mg/dL (ref 0.61–1.24)
Calcium: 9.5 mg/dL (ref 8.9–10.3)
Glucose, Bld: 190 mg/dL — ABNORMAL HIGH (ref 65–99)
POTASSIUM: 4.1 mmol/L (ref 3.5–5.1)
SODIUM: 137 mmol/L (ref 135–145)
Total Bilirubin: 1.4 mg/dL — ABNORMAL HIGH (ref 0.3–1.2)
Total Protein: 6.6 g/dL (ref 6.5–8.1)

## 2015-11-29 LAB — I-STAT CG4 LACTIC ACID, ED: LACTIC ACID, VENOUS: 2.39 mmol/L — AB (ref 0.5–2.0)

## 2015-11-29 MED ORDER — METOCLOPRAMIDE HCL 5 MG/ML IJ SOLN
10.0000 mg | Freq: Once | INTRAMUSCULAR | Status: AC
  Administered 2015-11-30: 10 mg via INTRAVENOUS
  Filled 2015-11-29: qty 2

## 2015-11-29 NOTE — ED Notes (Signed)
Bed: ZO10WA05 Expected date:  Expected time:  Means of arrival:  Comments: EMS 82yo M Hematemesis

## 2015-11-29 NOTE — H&P (Signed)
[facility] History and Physical  [name]:[id number] DOB: 10-20-1930 DOA: 11/29/2015  Referring physician: Dr. Clayborne Dana, ED [name] PCP: Danny Fillers, MD   Chief Complaint: Hematemesis  HPI: [name] is a 82 y.o. male with hx of DM and HOH who presents with nausea, vomiting and an episode of hematemesis at home today.  Brought to ED by EMS from home.  Patient had been feeling all right until today became nauseous and vomiting several times w no blood.  Then had emesis with dark red liquid, blood-like x 1-2.  No vomiting here. Hb here is 14. Asked to admit OBS status.    Patient denies any hx of ulcer, GIB or transfusion.  No abd pain, recent surgery, no etoh or tobacco use.  No CP or SOB.     Chart review: Dec 2011 - recent C3-4 and C6-7 discectomy, dc'd home > came back same day with difficulty swallowing.  Went to OR where hematoma/seroma was evacuated from C3-4 incision.  Pt's voice improved significantly post-op.  Pt was dcd' home, f/u in office Sept 2012 - s/p lumbar decompression surgery on Sept 24, 2012, returned with fever/ urinary retention. Rx for poss PNA and UTI.  SNF placement was required. Rash from Keflex.   ROS  denies CP  no joint pain   no HA  no blurry vision  no rash  no diarrhea  no nausea/ vomiting  no dysuria  no difficulty voiding  no change in urine color    Where does patient live home Can patient participate in ADLs? Yes   Past Medical History  Past Medical History  Diagnosis Date  . Diabetes mellitus without complication (HCC)   . HOH (hard of hearing)    Past Surgical History No past surgical history on file. Family History No family history on file. Social History  has no tobacco, alcohol, and drug history on file. Allergies No Known Allergies Home medications Prior to Admission medications   Medication Sig Start Date End Date Taking? Authorizing Provider  TOPROL XL 50 MG 24 hr tablet Take 50 mg by mouth daily.  10/18/15  Yes  Historical Provider, MD  triamterene-hydrochlorothiazide (MAXZIDE-25) 37.5-25 MG tablet Take 0.5 tablets by mouth daily.   Yes Historical Provider, MD  Vitamin D, Ergocalciferol, (DRISDOL) 50000 units CAPS capsule Take 50,000 Units by mouth once a week. Sundays 11/03/15  Yes Historical Provider, MD  VYTORIN 10-40 MG tablet Take 0.5 tablets by mouth daily.  09/12/15  Yes Historical Provider, MD  nitroGLYCERIN (NITROSTAT) 0.4 MG SL tablet PLACE 1 TABLET UNDER THE TONGUE AS NEEDED FOR CHEST PAIN 11/03/15   Historical Provider, MD   Liver Function Tests  Recent Labs Lab 11/29/15 2150  AST 26  ALT 26  ALKPHOS 39  BILITOT 1.4*  PROT 6.6  ALBUMIN 4.0   No results for input(s): LIPASE, AMYLASE in the last 168 hours. CBC  Recent Labs Lab 11/29/15 2150  WBC 12.3*  HGB 14.3  HCT 42.4  MCV 90.6  PLT 191   Basic Metabolic Panel  Recent Labs Lab 11/29/15 2150  NA 137  K 4.1  CL 103  CO2 24  GLUCOSE 190*  BUN 20  CREATININE 0.72  CALCIUM 9.5     Filed Vitals:   11/29/15 2119 11/29/15 2248  BP: 148/56 162/73  Pulse: 56 58  Temp: 98.1 F (36.7 C)   TempSrc: Oral   Resp: 11 12  SpO2: 100% 100%   Exam: VS: BP 148/56  HR 56 bpm  RR 11   Temp 98.1   RA 100% Gen HOH, no distress, calm, WDWN No rash, cyanosis or gangrene Sclera anicteric, throat clear and moist  No jvd or bruits Chest clear bilat RRR no MRG Abd soft ntnd no mass or ascites +bs GU normal male MS no joint effusions or deformity Ext no LE or UE edema / no wounds or ulcers Neuro is alert, Ox 3 , nf   EKG (independently reviewed) > not done CXR (independently reviewed) > not done  Home medications > ToprolXL 50 mg/ 24hr, triamterene/ HCT, vit D, Vytorin, NTG SL  Na 137 Creat 0.72 CO2 24  Ca 9.5  Alb 4.0   LFT's ok  WBC 12k  Hb 14  plt 191 Glu 190  Lact acid 2.39   Assessment: 1. Hematemesis - on the heels of several episodes of N/V today. Possible MW tear, vs ulcer, gastritis, etc. Admit, OBS, serial  H/H, type and screen, IVF"s, IV protonix   2. HTN cont Toprol, hold diuretics for now 3. HOH 4. Nausea/ vomiting - unclear cause, not a chronic issue, appears acute. No fever or abd pain.   Plan - as above   DVT Prophylaxis SCD  Code Status: full  Family Communication: none here  Disposition Plan: home when better    [name],[name] D [facility] Pager [phone]  Cell [phone]  If 7PM-7AM, please contact night-coverage [email or web address] Password [email or web address] 11/29/2015, 11:46 PM

## 2015-11-29 NOTE — ED Notes (Addendum)
BIB EMS from home. C/o 3 episodes of dark red liquid emesis. Also endorses dizziness. Denies chest pain, headache, SOB, blurry vision or abdominal pain. 20g L AC. Given 4 mg Zofran in route. Hard of hearing. A&Ox4. Ambulatory.  EMS vitals HR 55 172CBG 142/56, 98%

## 2015-11-29 NOTE — ED Notes (Signed)
MD at bedside. 

## 2015-11-30 ENCOUNTER — Observation Stay (HOSPITAL_COMMUNITY): Payer: Medicare Other | Admitting: Anesthesiology

## 2015-11-30 ENCOUNTER — Encounter (HOSPITAL_COMMUNITY): Payer: Self-pay | Admitting: Nephrology

## 2015-11-30 ENCOUNTER — Encounter (HOSPITAL_COMMUNITY)
Admission: EM | Disposition: A | Discharge status: Home / Self Care | Payer: Self-pay | Source: Home / Self Care | Attending: Emergency Medicine

## 2015-11-30 DIAGNOSIS — K2211 Ulcer of esophagus with bleeding: Secondary | ICD-10-CM | POA: Diagnosis not present

## 2015-11-30 DIAGNOSIS — R110 Nausea: Secondary | ICD-10-CM

## 2015-11-30 DIAGNOSIS — D62 Acute posthemorrhagic anemia: Secondary | ICD-10-CM | POA: Diagnosis not present

## 2015-11-30 DIAGNOSIS — H9190 Unspecified hearing loss, unspecified ear: Secondary | ICD-10-CM

## 2015-11-30 DIAGNOSIS — H9193 Unspecified hearing loss, bilateral: Secondary | ICD-10-CM | POA: Diagnosis not present

## 2015-11-30 DIAGNOSIS — K920 Hematemesis: Secondary | ICD-10-CM

## 2015-11-30 DIAGNOSIS — R112 Nausea with vomiting, unspecified: Secondary | ICD-10-CM | POA: Diagnosis present

## 2015-11-30 DIAGNOSIS — E119 Type 2 diabetes mellitus without complications: Secondary | ICD-10-CM | POA: Diagnosis not present

## 2015-11-30 DIAGNOSIS — I10 Essential (primary) hypertension: Secondary | ICD-10-CM | POA: Diagnosis present

## 2015-11-30 HISTORY — PX: ESOPHAGOGASTRODUODENOSCOPY: SHX5428

## 2015-11-30 LAB — HEMOGLOBIN AND HEMATOCRIT, BLOOD
HCT: 40.9 % (ref 39.0–52.0)
HCT: 30.1 % — ABNORMAL LOW (ref 39.0–52.0)
HEMATOCRIT: 33.8 % — AB (ref 39.0–52.0)
HEMOGLOBIN: 11.4 g/dL — AB (ref 13.0–17.0)
Hemoglobin: 13.9 g/dL (ref 13.0–17.0)
Hemoglobin: 10.3 g/dL — ABNORMAL LOW (ref 13.0–17.0)

## 2015-11-30 LAB — PREPARE RBC (CROSSMATCH)

## 2015-11-30 LAB — ABO/RH: ABO/RH(D): A POS

## 2015-11-30 LAB — MRSA PCR SCREENING: MRSA BY PCR: NEGATIVE

## 2015-11-30 LAB — OCCULT BLOOD GASTRIC / DUODENUM (SPECIMEN CUP): OCCULT BLOOD, GASTRIC: POSITIVE — AB

## 2015-11-30 SURGERY — EGD (ESOPHAGOGASTRODUODENOSCOPY)
Anesthesia: Monitor Anesthesia Care

## 2015-11-30 MED ORDER — ONDANSETRON HCL 4 MG/2ML IJ SOLN: 4.0000 mg | Freq: Four times a day (QID) | INTRAMUSCULAR | Status: DC | PRN

## 2015-11-30 MED ORDER — SODIUM CHLORIDE 0.9 % IV SOLN: INTRAVENOUS | Status: DC

## 2015-11-30 MED ORDER — ONDANSETRON HCL 4 MG PO TABS: 4.0000 mg | ORAL_TABLET | Freq: Four times a day (QID) | ORAL | Status: DC | PRN

## 2015-11-30 MED ORDER — PHENYLEPHRINE 40 MCG/ML (10ML) SYRINGE FOR IV PUSH (FOR BLOOD PRESSURE SUPPORT)
PREFILLED_SYRINGE | INTRAVENOUS | Status: AC
  Filled 2015-11-30: qty 10

## 2015-11-30 MED ORDER — EZETIMIBE-SIMVASTATIN 10-40 MG PO TABS
0.5000 | ORAL_TABLET | Freq: Every day | ORAL | Status: DC
  Administered 2015-12-01 – 2015-12-02 (×2): 0.5 via ORAL
  Filled 2015-11-30 (×4): qty 0.5

## 2015-11-30 MED ORDER — ACETAMINOPHEN 325 MG PO TABS: 650.0000 mg | ORAL_TABLET | Freq: Four times a day (QID) | ORAL | Status: DC | PRN

## 2015-11-30 MED ORDER — ACETAMINOPHEN 650 MG RE SUPP: 650.0000 mg | Freq: Four times a day (QID) | RECTAL | Status: DC | PRN

## 2015-11-30 MED ORDER — EPINEPHRINE HCL 0.1 MG/ML IJ SOSY
PREFILLED_SYRINGE | INTRAMUSCULAR | Status: DC | PRN
  Administered 2015-11-30: 5.5 mL

## 2015-11-30 MED ORDER — MORPHINE SULFATE (PF) 2 MG/ML IV SOLN
2.0000 mg | INTRAVENOUS | Status: DC | PRN
  Administered 2015-11-30: 2 mg via INTRAVENOUS
  Filled 2015-11-30: qty 1

## 2015-11-30 MED ORDER — PROCHLORPERAZINE EDISYLATE 5 MG/ML IJ SOLN
10.0000 mg | Freq: Four times a day (QID) | INTRAMUSCULAR | Status: DC | PRN
  Filled 2015-11-30: qty 2

## 2015-11-30 MED ORDER — ONDANSETRON HCL 4 MG/2ML IJ SOLN
INTRAMUSCULAR | Status: AC
  Filled 2015-11-30: qty 2

## 2015-11-30 MED ORDER — LACTATED RINGERS IV SOLN
INTRAVENOUS | Status: DC
  Administered 2015-11-30: 11:00:00 via INTRAVENOUS

## 2015-11-30 MED ORDER — ONDANSETRON HCL 4 MG/2ML IJ SOLN
4.0000 mg | Freq: Once | INTRAMUSCULAR | Status: AC
  Administered 2015-11-30: 4 mg via INTRAVENOUS

## 2015-11-30 MED ORDER — PHENYLEPHRINE HCL 10 MG/ML IJ SOLN
INTRAMUSCULAR | Status: DC | PRN
  Administered 2015-11-30 (×3): 80 ug via INTRAVENOUS

## 2015-11-30 MED ORDER — SUCRALFATE 1 GM/10ML PO SUSP
1.0000 g | Freq: Three times a day (TID) | ORAL | Status: DC
Start: 2015-11-30 — End: 2015-12-02
  Administered 2015-11-30 – 2015-12-02 (×7): 1 g via ORAL
  Filled 2015-11-30 (×11): qty 10

## 2015-11-30 MED ORDER — PROPOFOL 10 MG/ML IV BOLUS
INTRAVENOUS | Status: DC | PRN
  Administered 2015-11-30 (×2): 50 mg via INTRAVENOUS

## 2015-11-30 MED ORDER — SODIUM CHLORIDE 0.9 % IV SOLN
Freq: Once | INTRAVENOUS | Status: DC
Start: 2015-11-30 — End: 2015-12-02

## 2015-11-30 MED ORDER — METOPROLOL SUCCINATE ER 50 MG PO TB24
50.0000 mg | ORAL_TABLET | Freq: Every day | ORAL | Status: DC
  Administered 2015-12-01 – 2015-12-02 (×2): 50 mg via ORAL
  Filled 2015-11-30: qty 2
  Filled 2015-11-30: qty 1
  Filled 2015-11-30: qty 2

## 2015-11-30 MED ORDER — ONDANSETRON HCL 4 MG/2ML IJ SOLN
4.0000 mg | Freq: Four times a day (QID) | INTRAMUSCULAR | Status: DC | PRN
  Administered 2015-11-30: 4 mg via INTRAVENOUS
  Filled 2015-11-30: qty 2

## 2015-11-30 MED ORDER — PROCHLORPERAZINE EDISYLATE 5 MG/ML IJ SOLN
10.0000 mg | INTRAMUSCULAR | Status: DC | PRN
  Administered 2015-11-30 (×2): 10 mg via INTRAVENOUS
  Filled 2015-11-30: qty 2

## 2015-11-30 MED ORDER — PROPOFOL 10 MG/ML IV BOLUS
INTRAVENOUS | Status: AC
  Filled 2015-11-30: qty 20

## 2015-11-30 MED ORDER — PANTOPRAZOLE SODIUM 40 MG IV SOLR
40.0000 mg | Freq: Two times a day (BID) | INTRAVENOUS | Status: DC
  Administered 2015-11-30 – 2015-12-02 (×6): 40 mg via INTRAVENOUS
  Filled 2015-11-30 (×7): qty 40

## 2015-11-30 MED ORDER — SODIUM CHLORIDE 0.45 % IV SOLN
INTRAVENOUS | Status: DC
  Administered 2015-11-30: 1000 mL via INTRAVENOUS
  Administered 2015-12-01: 13:00:00 via INTRAVENOUS

## 2015-11-30 NOTE — ED Notes (Signed)
Floor called for report. Nurse will call back.

## 2015-11-30 NOTE — ED Provider Notes (Signed)
CSN: [id number]     Arrival date & time 11/29/15  2107 History   First MD Initiated Contact with Patient 11/29/15 2148     Chief Complaint  Patient presents with  . Hematemesis     (Consider location/radiation/quality/duration/timing/severity/associated sxs/prior Treatment) Patient is a 82 y.o. male presenting with vomiting.  Emesis Severity:  Mild Duration:  3 hours Quality:  Bright red blood and stomach contents Chronicity:  New Recent urination:  Normal Associated symptoms: no abdominal pain and no headaches     Past Medical History  Diagnosis Date  . HOH (hard of hearing)   . Hypertension    Past Surgical History  Procedure Laterality Date  . No past surgeries     No family history on file. Social History  Substance Use Topics  . Smoking status: Not on file  . Smokeless tobacco: Not on file  . Alcohol Use: Not on file    Review of Systems  Cardiovascular: Negative for chest pain.  Gastrointestinal: Positive for nausea and vomiting. Negative for abdominal pain and abdominal distention.  Genitourinary: Positive for hematuria.  Musculoskeletal: Negative for back pain.  Skin: Negative for rash and wound.  Neurological: Negative for headaches.  All other systems reviewed and are negative.     Allergies  Review of patient's allergies indicates no known allergies.  Home Medications   Prior to Admission medications   Medication Sig Start Date End Date Taking? Authorizing Provider  TOPROL XL 50 MG 24 hr tablet Take 50 mg by mouth daily.  10/18/15  Yes Historical Provider, MD  triamterene-hydrochlorothiazide (MAXZIDE-25) 37.5-25 MG tablet Take 0.5 tablets by mouth daily.   Yes Historical Provider, MD  Vitamin D, Ergocalciferol, (DRISDOL) 50000 units CAPS capsule Take 50,000 Units by mouth once a week. Sundays 11/03/15  Yes Historical Provider, MD  VYTORIN 10-40 MG tablet Take 0.5 tablets by mouth daily.  09/12/15  Yes Historical Provider, MD  nitroGLYCERIN (NITROSTAT)  0.4 MG SL tablet PLACE 1 TABLET UNDER THE TONGUE AS NEEDED FOR CHEST PAIN 11/03/15   Historical Provider, MD   BP 148/50 mmHg  Pulse 60  Temp(Src) 97.6 F (36.4 C) (Oral)  Resp 16  Ht 5\' 8"  (1.727 m)  Wt 174 lb 13.2 oz (79.3 kg)  BMI 26.59 kg/m2  SpO2 97% Physical Exam  Constitutional: He is oriented to person, place, and time. He appears well-developed and well-nourished.  HENT:  Head: Normocephalic and atraumatic.  Eyes: Pupils are equal, round, and reactive to light.  Neck: Normal range of motion.  Cardiovascular: Normal rate.   Pulmonary/Chest: Effort normal and breath sounds normal. No respiratory distress. He has no wheezes. He has no rales.  Abdominal: He exhibits no distension.  Musculoskeletal: Normal range of motion. He exhibits no edema or tenderness.  Neurological: He is alert and oriented to person, place, and time.  Skin: Skin is warm and dry. No rash noted. No pallor.  Nursing note and vitals reviewed.   ED Course  Procedures (including critical care time) Labs Review Labs Reviewed  COMPREHENSIVE METABOLIC PANEL - Abnormal; Notable for the following:    Glucose, Bld 190 (*)    Total Bilirubin 1.4 (*)    All other components within normal limits  CBC - Abnormal; Notable for the following:    WBC 12.3 (*)    All other components within normal limits  OCCULT BLOOD GASTRIC / DUODENUM (SPECIMEN CUP) - Abnormal; Notable for the following:    Occult Blood, Gastric POSITIVE (*)  All other components within normal limits  I-STAT CG4 LACTIC ACID, ED - Abnormal; Notable for the following:    Lactic Acid, Venous 2.39 (*)    All other components within normal limits  HEMOGLOBIN AND HEMATOCRIT, BLOOD  HEMOGLOBIN AND HEMATOCRIT, BLOOD  HEMOGLOBIN AND HEMATOCRIT, BLOOD  TYPE AND SCREEN  ABO/RH    Imaging Review No results found. I have personally reviewed and evaluated these images and lab results as part of my medical decision-making.   EKG Interpretation None       MDM   Final diagnoses:  Non-intractable vomiting with nausea, vomiting of unspecified type  Hematemesis with nausea    82 yo here with a couple episodes of hematemesis. HDS here, hemoglobin stable, abdomen benign. No need for acute GI intervention unless symptoms continue or becomes unstable. Will obs o/n for serial hemoglobins.      Marily Memos, MD 11/30/15 [id number]

## 2015-11-30 NOTE — Progress Notes (Addendum)
Patient seen and examined this morning, 82 year old male with history of diabetes admitted overnight with hematemesis. No prior history of GI bleeding.   Hematemesis - Discussed with Dr. Madilyn FiremanHayes from gastroenterology, patient will undergo an EGD today.   Addendum: EGD with actively bleeding esophageal ulcer status post epinephrine treatment. Significant vomiting in the postop area with large amounts of clots per GI note, will get 2 units of packed red blood cells and remain in step down.  Hypertension - Continue Toprol, hold hydrochlorothiazide  HOH  Nausea and vomiting  Acute blood loss anemia - Hemoglobin is slowly trending down, we'll transfuse 2 units of packed red blood cell per GI given active bleeding postop   Danny Melhorn M. Elvera LennoxGherghe, MD [facility] [phone]

## 2015-11-30 NOTE — Progress Notes (Addendum)
Nursing Note: Pt arrived.Awake,alert resting with eyes closed.T-98.2 P-63 R-17 Bp- 137/55 PO2 97% on r/a.At 0055 pt began to gag and wretch and vomited 300 cc of bright red blood.Pt c/o feeling sick,nauseated.A: Zofran given Iv and protonix due and also given.At 0115 pt again began to gag and vomited 100 cc of bright red blood.Paged on-call T-97.6 P-60 R-16 Bp-148/50.Lab in to draw labs.Waiting response from page.Assessed lungs and clear.Pt educated to call if he vomits and HOB up.wbb

## 2015-11-30 NOTE — Progress Notes (Signed)
Passed moderate amount of stool bright red with clots, HR 74, B/P 188/53

## 2015-11-30 NOTE — Progress Notes (Signed)
Nursing Note: Dr.Schertz returned page.Will move pt to telemetry bed,give order for phenergan and page GI.wbb

## 2015-11-30 NOTE — Op Note (Signed)
[facility] Patient Name: [name] Procedure Date: 11/30/2015 MRN: [id number] Attending MD: Barrie Folk , MD Date of Birth: [birth year] CSN:  Age: 82 Admit Type: Inpatient Procedure:                Upper GI endoscopy Indications:              Hematemesis Providers:                Everardo All. Madilyn Fireman, MD, Priscella Mann, RN, Arlee Muslim, Technician Referring MD:              Medicines:                Propofol per Anesthesia Complications:            No immediate complications. Estimated Blood Loss:     Estimated blood loss: none. Procedure:                Pre-Anesthesia Assessment:                           - Prior to the procedure, a History and Physical                            was performed, and patient medications and                            allergies were reviewed. The patient's tolerance of                            previous anesthesia was also reviewed. The risks                            and benefits of the procedure and the sedation                            options and risks were discussed with the patient.                            All questions were answered, and informed consent                            was obtained. Prior Anticoagulants: The patient has                            taken no previous anticoagulant or antiplatelet                            agents. ASA Grade Assessment: II - A patient with                            mild systemic disease. After reviewing the risks  and benefits, the patient was deemed in                            satisfactory condition to undergo the procedure.                           After obtaining informed consent, the endoscope was                            passed under direct vision. Throughout the                            procedure, the patient's blood pressure, pulse, and                            oxygen saturations were monitored continuously. The                             EG-[id number] ([id number] scope was introduced through the                            mouth, and advanced to the second part of duodenum.                            The upper GI endoscopy was accomplished without                            difficulty. The patient tolerated the procedure                            well. Scope In: Scope Out: Findings:      One cratered esophageal ulcer with oozing blood was found 38 cm from the       incisors. The lesion was 5 mm in largest dimension. Area was       successfully injected with 5 mL of a 1:10,000 solution of epinephrine       for hemostasis.      Red blood was found in the stomach.      The examined duodenum was normal. Impression:               - Bleeding esophageal ulcer. Injected.                           - Red blood in the stomach.                           - Normal examined duodenum.                           - No specimens collected. Moderate Sedation:      N/A- Per Anesthesia Care Recommendation:           - Use sucralfate suspension 1 gram PO QID                            [duration].                           -  Return patient to ICU for ongoing care.                           - Clear liquid diet.                           - Continue present medications. Procedure Code(s):        --- Professional ---                           [phone], Esophagogastroduodenoscopy, flexible,                            transoral; with control of bleeding, any method Diagnosis Code(s):        --- Professional ---                           K22.11, Ulcer of esophagus with bleeding                           K92.2, Gastrointestinal hemorrhage, unspecified                           K92.0, Hematemesis CPT copyright 2016 [employer]. All rights reserved. The codes documented in this report are preliminary and upon coder review may  be revised to meet current compliance requirements. Dorena Cookey, MD Barrie Folk,  MD 11/30/2015 11:58:16 AM This report has been signed electronically. Number of Addenda: 0

## 2015-11-30 NOTE — Progress Notes (Signed)
Late entry: Pt in recovery very nauseous and having pain 10/10 in epigastric area, likely from the epi injection. Pt proceeded to vomit approx 1000 mls of blood clots. Dr Madilyn FiremanHayes and Dr Berneice HeinrichManny laid eyes on the patient. Per Dr Madilyn FiremanHayes zofran 4mg  was given. BP stable prior to return to ICU.

## 2015-11-30 NOTE — Anesthesia Postprocedure Evaluation (Signed)
Anesthesia Post Note  Patient: Danny PeachJames K Trujillo  Procedure(s) Performed: Procedure(s) (LRB): ESOPHAGOGASTRODUODENOSCOPY (EGD) (N/A)  Patient location during evaluation: Endoscopy Anesthesia Type: MAC Level of consciousness: awake and alert Pain management: pain level controlled Vital Signs Assessment: post-procedure vital signs reviewed and stable Respiratory status: spontaneous breathing, nonlabored ventilation, respiratory function stable and patient connected to nasal cannula oxygen Cardiovascular status: blood pressure returned to baseline and stable Postop Assessment: no signs of nausea or vomiting Anesthetic complications: no    Last Vitals:  Filed Vitals:   11/30/15 1230 11/30/15 1245  BP: 168/55 162/141  Pulse: 68 68  Temp:    Resp: 12 12    Last Pain:  Filed Vitals:   11/30/15 1254  PainSc: 10-Worst pain ever                 Sebastian Acheheodore Kalista Laguardia

## 2015-11-30 NOTE — Progress Notes (Signed)
EGD done. Actively bleeding Mallory-Weiss tear stopped bleeding after epinephrine. The patient vomited in the postop area significant amount of clots. Will monitor carefully and go ahead and transfuse 2 units packed red blood cells neck Carafate. Keep in ICU for now on PPI drip.

## 2015-11-30 NOTE — Progress Notes (Signed)
Nursing Note: Dr.Schertz on unit. Pt still c/o nausea.Pt to be transferred to step-down once bed available.wbb

## 2015-11-30 NOTE — Consult Note (Signed)
[facility] Consult Note  Referring Provider: No ref. provider found Primary Care Physician:  Donnajean Lopes, MD Primary Gastroenterologist:  Dr.  Laurel Dimmer Complaint: Hematemesis HPI: [name] is an 82 y.o. white male  developed several episodes of nonbloody emesis followed by bright red hematemesis on 2 or 3 occasions. Last emesis was at midnight. He has not had a stool in 2 days. Hemoglobin has been stable. He is currently not complaining of nausea or any chest or abdominal pain. He's had no previous GI bleeding and no previous endoscopy.  Past Medical History  Diagnosis Date  . HOH (hard of hearing)   . Hypertension     Past Surgical History  Procedure Laterality Date  . No past surgeries      Medications Prior to Admission  Medication Sig Dispense Refill  . TOPROL XL 50 MG 24 hr tablet Take 50 mg by mouth daily.     Marland Kitchen triamterene-hydrochlorothiazide (MAXZIDE-25) 37.5-25 MG tablet Take 0.5 tablets by mouth daily.    . Vitamin D, Ergocalciferol, (DRISDOL) 50000 units CAPS capsule Take 50,000 Units by mouth once a week. Sundays  0  . VYTORIN 10-40 MG tablet Take 0.5 tablets by mouth daily.     . nitroGLYCERIN (NITROSTAT) 0.4 MG SL tablet PLACE 1 TABLET UNDER THE TONGUE AS NEEDED FOR CHEST PAIN  1    Allergies: No Known Allergies  History reviewed. No pertinent family history.  Social History:  reports that he has quit smoking. His smoking use included Cigarettes and Pipe. He smoked 0.25 packs per day. He quit smokeless tobacco use about 20 years ago. He reports that he does not drink alcohol or use illicit drugs.  Review of Systems: negative except As above   Blood pressure 169/113, pulse 72, temperature 98.2 F (36.8 C), temperature source Oral, resp. rate 22, height '5\' 8"'$  (1.727 m), weight 79.3 kg (174 lb 13.2 oz), SpO2 96 %. Head: Normocephalic, without obvious abnormality, atraumatic Neck: no adenopathy, no carotid bruit, no JVD, supple, symmetrical,  trachea midline and thyroid not enlarged, symmetric, no tenderness/mass/nodules Resp: clear to auscultation bilaterally Cardio: regular rate and rhythm, S1, S2 normal, no murmur, click, rub or gallop GI: Abdomen soft nondistended with normoactive bowel sounds. No Megaly Masses or Guarding Extremities: extremities normal, atraumatic, no cyanosis or edema  Results for orders placed or performed during the hospital encounter of 11/29/15 (from the past 48 hour(s))  Comprehensive metabolic panel     Status: Abnormal   Collection Time: 11/29/15  9:50 PM  Result Value Ref Range   Sodium 137 135 - 145 mmol/L   Potassium 4.1 3.5 - 5.1 mmol/L   Chloride 103 101 - 111 mmol/L   CO2 24 22 - 32 mmol/L   Glucose, Bld 190 (H) 65 - 99 mg/dL   BUN 20 6 - 20 mg/dL   Creatinine, Ser 0.72 0.61 - 1.24 mg/dL   Calcium 9.5 8.9 - 10.3 mg/dL   Total Protein 6.6 6.5 - 8.1 g/dL   Albumin 4.0 3.5 - 5.0 g/dL   AST 26 15 - 41 U/L   ALT 26 17 - 63 U/L   Alkaline Phosphatase 39 38 - 126 U/L   Total Bilirubin 1.4 (H) 0.3 - 1.2 mg/dL   GFR calc non Af Amer >60 >60 mL/min   GFR calc Af Amer >60 >60 mL/min    Comment: (NOTE) The eGFR has been calculated using the CKD EPI equation. This calculation has not been validated in all clinical situations.  eGFR's persistently <60 mL/min signify possible Chronic Kidney Disease.    Anion gap 10 5 - 15  CBC     Status: Abnormal   Collection Time: 11/29/15  9:50 PM  Result Value Ref Range   WBC 12.3 (H) 4.0 - 10.5 K/uL   RBC 4.68 4.22 - 5.81 MIL/uL   Hemoglobin 14.3 13.0 - 17.0 g/dL   HCT 42.4 39.0 - 52.0 %   MCV 90.6 78.0 - 100.0 fL   MCH 30.6 26.0 - 34.0 pg   MCHC 33.7 30.0 - 36.0 g/dL   RDW 13.0 11.5 - 15.5 %   Platelets 191 150 - 400 K/uL  Type and screen [place]     Status: None   Collection Time: 11/29/15  9:50 PM  Result Value Ref Range   ABO/RH(D) A POS    Antibody Screen NEG    Sample Expiration 12/02/2015   I-Stat CG4 Lactic Acid,  ED     Status: Abnormal   Collection Time: 11/29/15 10:09 PM  Result Value Ref Range   Lactic Acid, Venous 2.39 (HH) 0.5 - 2.0 mmol/L   Comment NOTIFIED PHYSICIAN   Occult bld gastric/duodenum (cup to lab)     Status: Abnormal   Collection Time: 11/30/15 12:30 AM  Result Value Ref Range   pH, Gastric 7+    Occult Blood, Gastric POSITIVE (A) NEGATIVE  Hemoglobin and hematocrit, blood     Status: None   Collection Time: 11/30/15  1:00 AM  Result Value Ref Range   Hemoglobin 13.9 13.0 - 17.0 g/dL   HCT 40.9 39.0 - 52.0 %  MRSA PCR Screening     Status: None   Collection Time: 11/30/15  3:58 AM  Result Value Ref Range   MRSA by PCR NEGATIVE NEGATIVE    Comment:        The GeneXpert MRSA Assay (FDA approved for NASAL specimens only), is one component of a comprehensive MRSA colonization surveillance program. It is not intended to diagnose MRSA infection nor to guide or monitor treatment for MRSA infections.    No results found.  Assessment: Upper GI bleed with hematemesis following nonbloody emesis, suspect Mallory-Weiss tear. Plan:  Proceed with EGD this morning. Caelin Rayl C 11/30/2015, 6:41 AM  Pager [phone] If no answer or after 5 PM call [phone]

## 2015-11-30 NOTE — Progress Notes (Signed)
Nursing Note: Compazine 10 mg Iv given for  Nausea. Pt says."I am so sick."" I have never been so sick".wbb

## 2015-11-30 NOTE — Progress Notes (Signed)
Nursing Note: [facility] and pt and belongings moved to room 1230.wbb

## 2015-11-30 NOTE — Anesthesia Preprocedure Evaluation (Addendum)
Anesthesia Evaluation  Patient identified by MRN, date of birth, ID band Patient awake    Reviewed: Allergy & Precautions, NPO status , Patient's Chart, lab work & pertinent test results  Airway Mallampati: II   Neck ROM: Full    Dental  (+) Dental Advisory Given, Teeth Intact   Pulmonary neg pulmonary ROS, former smoker (quit 1997),    breath sounds clear to auscultation       Cardiovascular hypertension, Pt. on medications negative cardio ROS   Rhythm:Regular     Neuro/Psych negative neurological ROS  negative psych ROS   GI/Hepatic negative GI ROS, Neg liver ROS, Hematemisis     Endo/Other  negative endocrine ROSdiabetes  Renal/GU negative Renal ROS  negative genitourinary   Musculoskeletal negative musculoskeletal ROS (+)   Abdominal   Peds negative pediatric ROS (+)  Hematology negative hematology ROS (+) 11/33 11/30/2015   Anesthesia Other Findings   Reproductive/Obstetrics negative OB ROS                            Anesthesia Physical Anesthesia Plan  ASA: II  Anesthesia Plan: MAC   Post-op Pain Management:    Induction: Intravenous  Airway Management Planned: Nasal Cannula  Additional Equipment:   Intra-op Plan:   Post-operative Plan:   Informed Consent: I have reviewed the patients History and Physical, chart, labs and discussed the procedure including the risks, benefits and alternatives for the proposed anesthesia with the patient or authorized representative who has indicated his/her understanding and acceptance.     Plan Discussed with:   Anesthesia Plan Comments:         Anesthesia Quick Evaluation

## 2015-11-30 NOTE — ED Notes (Signed)
Pt had one episode of hematemesis. Specimen collected and at bedside

## 2015-11-30 NOTE — Transfer of Care (Signed)
Immediate Anesthesia Transfer of Care Note  Patient: Danny PeachJames K Trujillo  Procedure(s) Performed: Procedure(s): ESOPHAGOGASTRODUODENOSCOPY (EGD) (N/A)  Patient Location: PACU  Anesthesia Type:MAC  Level of Consciousness: sedated  Airway & Oxygen Therapy: Patient Spontanous Breathing and Patient connected to nasal cannula oxygen  Post-op Assessment: Report given to RN and Post -op Vital signs reviewed and stable  Post vital signs: Reviewed and stable  Last Vitals:  Filed Vitals:   11/30/15 0946 11/30/15 1043  BP: 118/53 103/47  Pulse: 74 77  Temp:  37.2 C  Resp: 17 19    Complications: No apparent anesthesia complications

## 2015-11-30 NOTE — Progress Notes (Signed)
Patient vomited blood twice since arriving to floor bed.  Repeat Hb 1 hour ago was 13.9.  BP stable and pt resting now with stable VS.  Plan to transfer to SDU; notified Dr Madilyn FiremanHayes w GI of situation.  He will see in am, sooner if needed.    Lamar Laundry. Vandella Ord MD [facility] cell [phone] 11/30/2015, 2:22 AM

## 2015-12-01 ENCOUNTER — Observation Stay (HOSPITAL_COMMUNITY): Payer: Medicare Other

## 2015-12-01 ENCOUNTER — Encounter (HOSPITAL_COMMUNITY): Payer: Self-pay | Admitting: Gastroenterology

## 2015-12-01 DIAGNOSIS — I10 Essential (primary) hypertension: Secondary | ICD-10-CM

## 2015-12-01 DIAGNOSIS — K920 Hematemesis: Secondary | ICD-10-CM | POA: Diagnosis not present

## 2015-12-01 DIAGNOSIS — R112 Nausea with vomiting, unspecified: Secondary | ICD-10-CM | POA: Diagnosis not present

## 2015-12-01 DIAGNOSIS — R110 Nausea: Secondary | ICD-10-CM | POA: Diagnosis not present

## 2015-12-01 LAB — TYPE AND SCREEN
ABO/RH(D): A POS
Antibody Screen: NEGATIVE
UNIT DIVISION: 0
Unit division: 0

## 2015-12-01 LAB — BASIC METABOLIC PANEL
ANION GAP: 5 (ref 5–15)
BUN: 38 mg/dL — AB (ref 6–20)
CO2: 28 mmol/L (ref 22–32)
Calcium: 8.4 mg/dL — ABNORMAL LOW (ref 8.9–10.3)
Chloride: 106 mmol/L (ref 101–111)
Creatinine, Ser: 0.7 mg/dL (ref 0.61–1.24)
GFR calc Af Amer: >60 mL/min (ref >60)
GLUCOSE: 136 mg/dL — AB (ref 65–99)
POTASSIUM: 4 mmol/L (ref 3.5–5.1)
Sodium: 139 mmol/L (ref 135–145)

## 2015-12-01 LAB — CBC
HEMATOCRIT: 31.9 % — AB (ref 39.0–52.0)
Hemoglobin: 11 g/dL — ABNORMAL LOW (ref 13.0–17.0)
MCH: 29.9 pg (ref 26.0–34.0)
MCHC: 34.5 g/dL (ref 30.0–36.0)
MCV: 86.7 fL (ref 78.0–100.0)
PLATELETS: 149 10*3/uL — AB (ref 150–400)
RBC: 3.68 MIL/uL — ABNORMAL LOW (ref 4.22–5.81)
RDW: 16 % — AB (ref 11.5–15.5)
WBC: 11.5 10*3/uL — AB (ref 4.0–10.5)

## 2015-12-01 MED ORDER — MEPERIDINE HCL 25 MG/ML IJ SOLN: 6.2500 mg | INTRAMUSCULAR | Status: DC | PRN

## 2015-12-01 MED ORDER — LORAZEPAM 2 MG/ML IJ SOLN
0.5000 mg | INTRAMUSCULAR | Status: DC | PRN
  Administered 2015-12-01: 0.5 mg via INTRAVENOUS
  Filled 2015-12-01: qty 1

## 2015-12-01 MED ORDER — GADOBENATE DIMEGLUMINE 529 MG/ML IV SOLN
20.0000 mL | Freq: Once | INTRAVENOUS | Status: AC | PRN
  Administered 2015-12-01: 16 mL via INTRAVENOUS

## 2015-12-01 MED ORDER — ONDANSETRON HCL 4 MG/2ML IJ SOLN: 4.0000 mg | Freq: Once | INTRAMUSCULAR | Status: DC

## 2015-12-01 NOTE — Progress Notes (Signed)
[facility] Progress Note  Subjective: Patient doing well, ambulating no more chest pain or vomiting one dark stool last night. Received 2 units packed red blood cells after endoscopy.  Objective: Vital signs in last 24 hours: Temp:  [97.7 F (36.5 C)-98.9 F (37.2 C)] 98.3 F (36.8 C) (04/06 0800) Pulse Rate:  [44-95] 89 (04/06 0700) Resp:  [0-24] 18 (04/06 0700) BP: (89-168)/(22-141) 151/53 mmHg (04/06 0700) SpO2:  [93 %-100 %] 97 % (04/06 0700) Weight:  [78.926 kg (174 lb)-80.8 kg (178 lb 2.1 oz)] 80.8 kg (178 lb 2.1 oz) (04/06 0230) Weight change: -0.374 kg (-13.2 oz)   JX:BJYNWPE:alert, orientd  Lab Results: Results for orders placed or performed during the hospital encounter of 11/29/15 (from the past 24 hour(s))  Prepare RBC     Status: None   Collection Time: 11/30/15 12:30 PM  Result Value Ref Range   Order Confirmation ORDER PROCESSED BY BLOOD BANK   Hemoglobin and hematocrit, blood     Status: Abnormal   Collection Time: 11/30/15  1:12 PM  Result Value Ref Range   Hemoglobin 10.3 (L) 13.0 - 17.0 g/dL   HCT 29.530.1 (L) 62.139.0 - 30.852.0 %  CBC     Status: Abnormal   Collection Time: 12/01/15  3:18 AM  Result Value Ref Range   WBC 11.5 (H) 4.0 - 10.5 K/uL   RBC 3.68 (L) 4.22 - 5.81 MIL/uL   Hemoglobin 11.0 (L) 13.0 - 17.0 g/dL   HCT 65.731.9 (L) 84.639.0 - 96.252.0 %   MCV 86.7 78.0 - 100.0 fL   MCH 29.9 26.0 - 34.0 pg   MCHC 34.5 30.0 - 36.0 g/dL   RDW 95.216.0 (H) 84.111.5 - 32.415.5 %   Platelets 149 (L) 150 - 400 K/uL  Basic metabolic panel     Status: Abnormal   Collection Time: 12/01/15  3:18 AM  Result Value Ref Range   Sodium 139 135 - 145 mmol/L   Potassium 4.0 3.5 - 5.1 mmol/L   Chloride 106 101 - 111 mmol/L   CO2 28 22 - 32 mmol/L   Glucose, Bld 136 (H) 65 - 99 mg/dL   BUN 38 (H) 6 - 20 mg/dL   Creatinine, Ser 4.010.70 0.61 - 1.24 mg/dL   Calcium 8.4 (L) 8.9 - 10.3 mg/dL   GFR calc non Af Amer >60 >60 mL/min   GFR calc Af Amer >60 >60 mL/min   Anion gap 5 5 - 15     Studies/Results: No results found.    Assessment: GI bleed from Mallory-Weiss tear, hopefully stopped  Plan: Continue PPI and Carafate Full liquid diet and advance as tolerated Continue to monitor stools and hemoglobin    Saron Vanorman C 12/01/2015, 10:01 AM  Pager [phone] If no answer or after 5 PM call [phone]

## 2015-12-01 NOTE — Progress Notes (Signed)
Attempted to call report

## 2015-12-01 NOTE — Progress Notes (Signed)
Pt transferred from ICU to room 1510. Pt AO x 4. Pt very HOH. Pt made aware of surroundings. Pt made aware to use call bell for assistance. No questions or concerns at this time.  Jamauri Kruzel W Jovanny Stephanie, RN

## 2015-12-01 NOTE — Progress Notes (Signed)
PROGRESS NOTE  Danny PeachJames K Evelyn WUJ:[id number] DOB: Jan 18, 1931 DOA: 11/29/2015 PCP: Garlan FillersPATERSON,DANIEL G, MD Outpatient Specialists:      Brief Narrative: 82 year old male with history of diabetes admitted with hematemesis in the setting of severe nausea associated with sudden onset dizziness. No prior history of GI bleeding.  Assessment & Plan: Principal Problem:   Hematemesis Active Problems:   Nausea & vomiting   Hypertension, essential   Hard of hearing   Hematemesis - GI has been consulted, patient underwent an EGD on 4/5 with actively bleeding esophageal ulcer status post epinephrine treatment (mallory weiss tear). Significant vomiting in the postop area with large amounts of clots per GI note, s/p 2 additional units of packed red blood cells  - Hb 11 this morning, stable, patient clinically improved, transfer to floor.  Dizziness - sudden onset, had a similar episode 6-7 months ago - MRI brain to r/o CVA. No Aspirin given GI bleed  Hypertension - Continue Toprol, hold hydrochlorothiazide  HOH  Nausea and vomiting - resolved  Acute blood loss anemia - Hb stable, clinically bleeding has resolved   DVT prophylaxis: SCD Code Status: Full Family Communication: d/w family bedside Disposition Plan: home when ready, 1-2 days Barriers for discharge: monitor CBC  Consultants:   GI - [facility]  Procedures:   EGD 4/5  Antimicrobials:  None    Subjective: - feeling "great", no complaints, no nausea/vomiting  Objective: Filed Vitals:   12/01/15 0500 12/01/15 0600 12/01/15 0700 12/01/15 0800  BP: 141/66 123/46 151/53   Pulse: 90 77 89   Temp:    98.3 F (36.8 C)  TempSrc:    Oral  Resp: 11 16 18    Height:      Weight:      SpO2: 97% 93% 97%     Intake/Output Summary (Last 24 hours) at 12/01/15 1114 Last data filed at 12/01/15 0600  Gross per 24 hour  Intake   2140 ml  Output    200 ml  Net   1940 ml   Filed Weights   11/30/15 0050 11/30/15 1043 12/01/15  0230  Weight: 79.3 kg (174 lb 13.2 oz) 78.926 kg (174 lb) 80.8 kg (178 lb 2.1 oz)   Examination: Constitutional: NAD Filed Vitals:   12/01/15 0500 12/01/15 0600 12/01/15 0700 12/01/15 0800  BP: 141/66 123/46 151/53   Pulse: 90 77 89   Temp:    98.3 F (36.8 C)  TempSrc:    Oral  Resp: 11 16 18    Height:      Weight:      SpO2: 97% 93% 97%   Eyes: PERRL, lids and conjunctivae normal Respiratory: clear to auscultation bilaterally, no wheezing, no crackles. Normal respiratory effort. No accessory muscle use.  Cardiovascular: Regular rate and rhythm, no murmurs / rubs / gallops. No extremity edema. 2+ pedal pulses Abdomen: no tenderness, no masses palpated. Bowel sounds positive. Neurologic: non focal   Data Reviewed: I have personally reviewed following labs and imaging studies  CBC:  Recent Labs Lab 11/29/15 2150 11/30/15 0100 11/30/15 0813 11/30/15 1312 12/01/15 0318  WBC 12.3*  --   --   --  11.5*  HGB 14.3 13.9 11.4* 10.3* 11.0*  HCT 42.4 40.9 33.8* 30.1* 31.9*  MCV 90.6  --   --   --  86.7  PLT 191  --   --   --  149*   Basic Metabolic Panel:  Recent Labs Lab 11/29/15 2150 12/01/15 0318  NA 137 139  K 4.1  4.0  CL 103 106  CO2 24 28  GLUCOSE 190* 136*  BUN 20 38*  CREATININE 0.72 0.70  CALCIUM 9.5 8.4*   Liver Function Tests:  Recent Labs Lab 11/29/15 2150  AST 26  ALT 26  ALKPHOS 39  BILITOT 1.4*  PROT 6.6  ALBUMIN 4.0   Urine analysis:    Component Value Date/Time   COLORURINE YELLOW 05/30/2011 1328   APPEARANCEUR CLEAR 05/30/2011 1328   LABSPEC 1.018 05/30/2011 1328   PHURINE 5.5 05/30/2011 1328   GLUCOSEU NEGATIVE 05/30/2011 1328   HGBUR NEGATIVE 05/30/2011 1328   BILIRUBINUR NEGATIVE 05/30/2011 1328   KETONESUR NEGATIVE 05/30/2011 1328   PROTEINUR NEGATIVE 05/30/2011 1328   UROBILINOGEN 1.0 05/30/2011 1328   NITRITE NEGATIVE 05/30/2011 1328   LEUKOCYTESUR NEGATIVE 05/30/2011 1328   Sepsis Labs: Invalid input(s): PROCALCITONIN,  LACTICIDVEN  Recent Results (from the past 240 hour(s))  MRSA PCR Screening     Status: None   Collection Time: 11/30/15  3:58 AM  Result Value Ref Range Status   MRSA by PCR NEGATIVE NEGATIVE Final    Comment:        The GeneXpert MRSA Assay (FDA approved for NASAL specimens only), is one component of a comprehensive MRSA colonization surveillance program. It is not intended to diagnose MRSA infection nor to guide or monitor treatment for MRSA infections.    Radiology Studies: No results found.  Scheduled Meds: . sodium chloride   Intravenous Once  . ezetimibe-simvastatin  0.5 tablet Oral Daily  . metoprolol succinate  50 mg Oral Daily  . ondansetron (ZOFRAN) IV  4 mg Intravenous Once  . pantoprazole (PROTONIX) IV  40 mg Intravenous Q12H  . sucralfate  1 g Oral TID WC & HS   Continuous Infusions: . sodium chloride 75 mL/hr at 12/01/15 0230    Pamella Pert, MD, PhD [facility] Pager [phone]  If 7PM-7AM, please contact night-coverage [email or web address] Password [email or web address] 12/01/2015, 11:14 AM

## 2015-12-02 DIAGNOSIS — R110 Nausea: Secondary | ICD-10-CM | POA: Diagnosis not present

## 2015-12-02 DIAGNOSIS — K920 Hematemesis: Secondary | ICD-10-CM | POA: Diagnosis not present

## 2015-12-02 LAB — CBC
HEMATOCRIT: 30.4 % — AB (ref 39.0–52.0)
HEMOGLOBIN: 10.3 g/dL — AB (ref 13.0–17.0)
MCH: 30 pg (ref 26.0–34.0)
MCHC: 33.9 g/dL (ref 30.0–36.0)
MCV: 88.6 fL (ref 78.0–100.0)
Platelets: 135 10*3/uL — ABNORMAL LOW (ref 150–400)
RBC: 3.43 MIL/uL — AB (ref 4.22–5.81)
RDW: 16.4 % — ABNORMAL HIGH (ref 11.5–15.5)
WBC: 7.7 10*3/uL (ref 4.0–10.5)

## 2015-12-02 MED ORDER — PANTOPRAZOLE SODIUM 40 MG PO TBEC: 40.0000 mg | DELAYED_RELEASE_TABLET | Freq: Two times a day (BID) | ORAL | Status: DC

## 2015-12-02 MED ORDER — SUCRALFATE 1 GM/10ML PO SUSP: 1.0000 g | Freq: Three times a day (TID) | ORAL | Status: DC

## 2015-12-02 NOTE — Discharge Instructions (Signed)
Follow with PATERSON,DANIEL G, MD in 5-7 days ° °Please get a complete blood count and chemistry panel checked by your Primary MD at your next visit, and again as instructed by your Primary MD. Please get your medications reviewed and adjusted by your Primary MD. ° °Please request your Primary MD to go over all Hospital Tests and Procedure/Radiological results at the follow up, please get all Hospital records sent to your Prim MD by signing hospital release before you go home. ° °If you had Pneumonia of Lung problems at the Hospital: °Please get a 2 view Chest X ray done in 6-8 weeks after hospital discharge or sooner if instructed by your Primary MD. ° °If you have Congestive Heart Failure: °Please call your Cardiologist or Primary MD anytime you have any of the following symptoms:  °1) 3 pound weight gain in 24 hours or 5 pounds in 1 week  °2) shortness of breath, with or without a dry hacking cough  °3) swelling in the hands, feet or stomach  °4) if you have to sleep on extra pillows at night in order to breathe ° °Follow cardiac low salt diet and 1.5 lit/day fluid restriction. ° °If you have diabetes °Accuchecks 4 times/day, Once in AM empty stomach and then before each meal. °Log in all results and show them to your primary doctor at your next visit. °If any glucose reading is under 80 or above 300 call your primary MD immediately. ° °If you have Seizure/Convulsions/Epilepsy: °Please do not drive, operate heavy machinery, participate in activities at heights or participate in high speed sports until you have seen by Primary MD or a Neurologist and advised to do so again. ° °If you had Gastrointestinal Bleeding: °Please ask your Primary MD to check a complete blood count within one week of discharge or at your next visit. Your endoscopic/colonoscopic biopsies that are pending at the time of discharge, will also need to followed by your Primary MD. ° °Get Medicines reviewed and adjusted. °Please take all your  medications with you for your next visit with your Primary MD ° °Please request your Primary MD to go over all hospital tests and procedure/radiological results at the follow up, please ask your Primary MD to get all Hospital records sent to his/her office. ° °If you experience worsening of your admission symptoms, develop shortness of breath, life threatening emergency, suicidal or homicidal thoughts you must seek medical attention immediately by calling 911 or calling your MD immediately  if symptoms less severe. ° °You must read complete instructions/literature along with all the possible adverse reactions/side effects for all the Medicines you take and that have been prescribed to you. Take any new Medicines after you have completely understood and accpet all the possible adverse reactions/side effects.  ° °Do not drive or operate heavy machinery when taking Pain medications.  ° °Do not take more than prescribed Pain, Sleep and Anxiety Medications ° °Special Instructions: If you have smoked or chewed Tobacco  in the last 2 yrs please stop smoking, stop any regular Alcohol  and or any Recreational drug use. ° °Wear Seat belts while driving. ° °Please note °You were cared for by a hospitalist during your hospital stay. If you have any questions about your discharge medications or the care you received while you were in the hospital after you are discharged, you can call the unit and asked to speak with the hospitalist on call if the hospitalist that took care of you is not available. Once   you are discharged, your primary care physician will handle any further medical issues. Please note that NO REFILLS for any discharge medications will be authorized once you are discharged, as it is imperative that you return to your primary care physician (or establish a relationship with a primary care physician if you do not have one) for your aftercare needs so that they can reassess your need for medications and monitor your  lab values. ° °You can reach the hospitalist office at phone [phone] or fax [phone] °  °If you do not have a primary care physician, you can call [phone] for a physician referral. ° °Activity: As tolerated with Full fall precautions use walker/cane & assistance as needed ° °Diet: regular ° °Disposition Home ° ° °

## 2015-12-02 NOTE — Discharge Summary (Signed)
Physician Discharge Summary  [name] ZOX:[id number] DOB: 1931-01-04 DOA: 11/29/2015  PCP: Garlan Fillers, MD  Admit date: 11/29/2015 Discharge date: 12/02/2015  Time spent: > 30 minutes  Recommendations for Outpatient Follow-up:  1. Follow up with Dr. Eloise Harman in 1-2 weeks 2. Repeat CBC   Discharge Diagnoses:  Principal Problem:   Hematemesis Active Problems:   Nausea & vomiting   Hypertension, essential   Hard of hearing   Discharge Condition: stable  Diet recommendation: regular  Filed Weights   11/30/15 0050 11/30/15 1043 12/01/15 0230  Weight: 79.3 kg (174 lb 13.2 oz) 78.926 kg (174 lb) 80.8 kg (178 lb 2.1 oz)    History of present illness:  See H&P, Labs, Consult and Test reports for all details in brief, patient is a 82 year old male with history of diabetes admitted with hematemesis in the setting of severe nausea associated with sudden onset dizziness. No prior history of GI bleeding.  Hospital Course Hematemesis - GI has been consulted, patient underwent an EGD on 4/5 with actively bleeding esophageal ulcer status post epinephrine treatment (mallory weiss tear). Significant vomiting in the postop area with large amounts of clots per GI note, s/p 2 additional units of packed red blood cells. Following the EGD patient has recovered well, had no further episodes of nausea vomiting or any bleeding events. He tolerated diet well, was feeling back to baseline, and discharged home in stable condition with Carafate and Protonix. Dizziness - sudden onset, had a similar episode 6-7 months ago, MRI brain to r/o CVA was negative. Likely peripheral cause, outpatient follow-up with PCP/ENT Hypertension - Continue home medications HOH Nausea and vomiting- resolved Acute blood loss anemia- Hb stable, clinically bleeding has resolved  Procedures:  EGD 4/5  - Bleeding esophageal ulcer. Injected. - Red blood in the stomach. - Normal examined duodenum. - No specimens  collected.  Consultations:  GI  Discharge Exam: Filed Vitals:   12/01/15 0800 12/01/15 1150 12/01/15 2200 12/02/15 0600  BP:  137/54 137/59 124/59  Pulse:  82 69   Temp: 98.3 F (36.8 C) 97.9 F (36.6 C) 98.5 F (36.9 C) 98.3 F (36.8 C)  TempSrc: Oral Oral Oral Oral  Resp:  Height:      Weight:      SpO2:  97% 98% 96%    General: NAD Cardiovascular: RRR Respiratory: CTA biL  Discharge Instructions Activity:  As tolerated   Get Medicines reviewed and adjusted: Please take all your medications with you for your next visit with your Primary MD  Please request your Primary MD to go over all hospital tests and procedure/radiological results at the follow up, please ask your Primary MD to get all Hospital records sent to his/her office.  If you experience worsening of your admission symptoms, develop shortness of breath, life threatening emergency, suicidal or homicidal thoughts you must seek medical attention immediately by calling 911 or calling your MD immediately if symptoms less severe.  You must read complete instructions/literature along with all the possible adverse reactions/side effects for all the Medicines you take and that have been prescribed to you. Take any new Medicines after you have completely understood and accpet all the possible adverse reactions/side effects.   Do not drive when taking Pain medications.   Do not take more than prescribed Pain, Sleep and Anxiety Medications  Special Instructions: If you have smoked or chewed Tobacco in the last 2 yrs please stop smoking, stop any regular Alcohol and or any  Recreational drug use.  Wear Seat belts while driving.  Please note  You were cared for by a hospitalist during your hospital stay. Once you are discharged, your primary care physician will handle any further medical issues. Please note that NO REFILLS for any discharge medications will be authorized once you are discharged, as it is  imperative that you return to your primary care physician (or establish a relationship with a primary care physician if you do not have one) for your aftercare needs so that they can reassess your need for medications and monitor your lab values.    Medication List    TAKE these medications        nitroGLYCERIN 0.4 MG SL tablet  Commonly known as:  NITROSTAT  PLACE 1 TABLET UNDER THE TONGUE AS NEEDED FOR CHEST PAIN     pantoprazole 40 MG tablet  Commonly known as:  PROTONIX  Take 1 tablet (40 mg total) by mouth 2 (two) times daily before a meal.     sucralfate 1 GM/10ML suspension  Commonly known as:  CARAFATE  Take 10 mLs (1 g total) by mouth 4 (four) times daily -  with meals and at bedtime.     TOPROL XL 50 MG 24 hr tablet  Generic drug:  metoprolol succinate  Take 50 mg by mouth daily.     triamterene-hydrochlorothiazide 37.5-25 MG tablet  Commonly known as:  MAXZIDE-25  Take 0.5 tablets by mouth daily.     Vitamin D (Ergocalciferol) 50000 units Caps capsule  Commonly known as:  DRISDOL  Take 50,000 Units by mouth once a week. Sundays     VYTORIN 10-40 MG tablet  Generic drug:  ezetimibe-simvastatin  Take 0.5 tablets by mouth daily.           Follow-up Information    Follow up with Garlan Fillers, MD. Schedule an appointment as soon as possible for a visit in 1 week.   Specialty:  Internal Medicine   Contact information:   [address] [place] [address] [phone]       The results of significant diagnostics from this hospitalization (including imaging, microbiology, ancillary and laboratory) are listed below for reference.    Significant Diagnostic Studies: Mr Lodema Pilot Contrast  Dec 08, 2015  CLINICAL DATA:  82 year old male with persistent dizziness and vomiting. Initial encounter. EXAM: MRI HEAD WITHOUT AND WITH CONTRAST TECHNIQUE: Multiplanar, multiecho pulse sequences of the brain and surrounding structures were obtained without and with  intravenous contrast. CONTRAST:  16mL MULTIHANCE GADOBENATE DIMEGLUMINE 529 MG/ML IV SOLN COMPARISON:  Cervical spine MRI 06/21/2010. Cervical spine radiographs 09/04/2010. FINDINGS: Cerebral volume is within normal limits for age. No restricted diffusion to suggest acute infarction. No midline shift, mass effect, evidence of mass lesion, ventriculomegaly, extra-axial collection or acute intracranial hemorrhage. Cervicomedullary junction and pituitary are within normal limits. Major intracranial vascular flow voids are within normal limits. Wallace Cullens and white matter signal is within normal limits for age throughout the brain. No encephalomalacia or chronic cerebral blood products. No abnormal enhancement identified. Mild vascular related dural thickening at the junction of the left anterior and middle cranial fossae (series 11, image 25) appears physiologic. Visible internal auditory structures appear normal. Mastoids are clear. Paranasal sinuses are clear. Postoperative changes to the globes. Otherwise negative orbit and scalp soft tissues. Normal bone marrow signal. IMPRESSION: Normal for age MRI appearance of the brain. Electronically Signed   By: Odessa Fleming M.D.   On: 12-08-2015 14:49    Microbiology: Recent  Results (from the past 240 hour(s))  MRSA PCR Screening     Status: None   Collection Time: 11/30/15  3:58 AM  Result Value Ref Range Status   MRSA by PCR NEGATIVE NEGATIVE Final    Comment:        The GeneXpert MRSA Assay (FDA approved for NASAL specimens only), is one component of a comprehensive MRSA colonization surveillance program. It is not intended to diagnose MRSA infection nor to guide or monitor treatment for MRSA infections.      Labs: Basic Metabolic Panel:  Recent Labs Lab 11/29/15 2150 12/01/15 0318  NA 137 139  K 4.1 4.0  CL 103 106  CO2 24 28  GLUCOSE 190* 136*  BUN 20 38*  CREATININE 0.72 0.70  CALCIUM 9.5 8.4*   Liver Function Tests:  Recent Labs Lab  11/29/15 2150  AST 26  ALT 26  ALKPHOS 39  BILITOT 1.4*  PROT 6.6  ALBUMIN 4.0   CBC:  Recent Labs Lab 11/29/15 2150 11/30/15 0100 11/30/15 0813 11/30/15 1312 12/01/15 0318 12/02/15 0523  WBC 12.3*  --   --   --  11.5* 7.7  HGB 14.3 13.9 11.4* 10.3* 11.0* 10.3*  HCT 42.4 40.9 33.8* 30.1* 31.9* 30.4*  MCV 90.6  --   --   --  86.7 88.6  PLT 191  --   --   --  149* 135*    Signed:  Anella Nakata  [facility] 12/02/2015, 2:10 PM

## 2018-10-27 ENCOUNTER — Other Ambulatory Visit: Payer: Self-pay | Admitting: Cardiology

## 2018-12-11 ENCOUNTER — Other Ambulatory Visit: Payer: Self-pay | Admitting: Cardiology

## 2018-12-11 DIAGNOSIS — I351 Nonrheumatic aortic (valve) insufficiency: Secondary | ICD-10-CM

## 2019-03-03 ENCOUNTER — Other Ambulatory Visit: Payer: Self-pay

## 2019-03-03 ENCOUNTER — Ambulatory Visit (INDEPENDENT_AMBULATORY_CARE_PROVIDER_SITE_OTHER): Payer: Medicare Other

## 2019-03-03 DIAGNOSIS — I351 Nonrheumatic aortic (valve) insufficiency: Secondary | ICD-10-CM | POA: Diagnosis not present

## 2019-03-11 ENCOUNTER — Ambulatory Visit (INDEPENDENT_AMBULATORY_CARE_PROVIDER_SITE_OTHER): Payer: Medicare Other | Admitting: Cardiology

## 2019-03-11 ENCOUNTER — Other Ambulatory Visit: Payer: Self-pay

## 2019-03-11 ENCOUNTER — Encounter: Payer: Self-pay | Admitting: Cardiology

## 2019-03-11 VITALS — BP 138/77 | HR 67 | Temp 98.0°F | Ht 68.0 in | Wt 189.2 lb

## 2019-03-11 DIAGNOSIS — E119 Type 2 diabetes mellitus without complications: Secondary | ICD-10-CM

## 2019-03-11 DIAGNOSIS — R002 Palpitations: Secondary | ICD-10-CM

## 2019-03-11 DIAGNOSIS — I351 Nonrheumatic aortic (valve) insufficiency: Secondary | ICD-10-CM | POA: Diagnosis not present

## 2019-03-11 DIAGNOSIS — I10 Essential (primary) hypertension: Secondary | ICD-10-CM | POA: Diagnosis not present

## 2019-03-11 NOTE — Progress Notes (Signed)
Primary Physician:  Leanna Battles, MD   Patient ID: [name], male    DOB: December 18, 1930, 85 y.o.   MRN: [id number]  Subjective:    Chief Complaint  Patient presents with  . aortic regurgitation  . Hypertension  . Follow-up    43mo   HPI: [name] is a 85y.o. male  with type II diabetes mellitus,moderate aortic regurgitation, previous episodes of syncope felt to be possibly related to sick sinus syndrome and Metoprolol succinate was decreased from 50 mg to 25 mg. No recurrence since.   Patient is here on 6 month office visit and follow up for aortic regurgitation and to discuss recent echo results. He is presently doing well. He does mention palpitations described as flip flopping sensation that only occurs while or shortly after picking blue berries. Symptoms can last for a few minutes to a few hours. No associated dyspnea of lightheadedness. Wonders if liftining his arms causes this. He does mention that he sweats a lot with this. Blood pressure has remained well controlled. He reports that hyperlipidemia is well controlled. Diabetes is diet controlled, last HgbA1c 6.8%. Denies any shortness of breath or chest pain. He continues to mow his yard that is 2-3 acres without difficulty.  Past Medical History:  Diagnosis Date  . HOH (hard of hearing)   . Hypertension     Past Surgical History:  Procedure Laterality Date  . ESOPHAGOGASTRODUODENOSCOPY N/A 11/30/2015   Procedure: ESOPHAGOGASTRODUODENOSCOPY (EGD);  Surgeon: JTeena Irani MD;  [facility];  Service: Endoscopy;  Laterality: N/A;  . NO PAST SURGERIES      Social History   Socioeconomic History  . Marital status: Widowed    Spouse name: Not on file  . Number of children: 1  . Years of education: Not on file  . Highest education level: Not on file  Occupational History  . Not on file  Social Needs  . Financial resource strain: Not on file  . Food insecurity    Worry: Not on file    Inability:  Not on file  . Transportation needs    Medical: Not on file    Non-medical: Not on file  Tobacco Use  . Smoking status: Former Smoker    Packs/day: 0.25    Years: 50.00    Pack years: 12.50    Types: Cigarettes, Pipe    Quit date: 03/10/1994    Years since quitting: 25.0  . Smokeless tobacco: Former [employer]ystems developer   Quit date: 11/30/1995  Substance and Sexual Activity  . Alcohol use: No  . Drug use: No  . Sexual activity: Never  Lifestyle  . Physical activity    Days per week: Not on file    Minutes per session: Not on file  . Stress: Not on file  Relationships  . Social [place] phone: Not on file    Gets together: Not on file    Attends religious service: Not on file    Active member of club or organization: Not on file    Attends meetings of clubs or organizations: Not on file    Relationship status: Not on file  . Intimate partner violence    Fear of current or ex partner: Not on file    Emotionally abused: Not on file    Physically abused: Not on file    Forced sexual activity: Not on file  Other Topics Concern  . Not on file  Social History Narrative  . Not on file    Review of Systems  Constitution: Negative for decreased appetite, malaise/fatigue, weight gain and weight loss.  Eyes: Negative for visual disturbance.  Cardiovascular: Positive for palpitations. Negative for chest pain, claudication, dyspnea on exertion, leg swelling, orthopnea and syncope.  Respiratory: Negative for hemoptysis and wheezing.   Endocrine: Negative for cold intolerance and heat intolerance.  Hematologic/Lymphatic: Does not bruise/bleed easily.  Skin: Negative for nail changes.  Musculoskeletal: Positive for back pain. Negative for muscle weakness and myalgias.  Gastrointestinal: Negative for abdominal pain, change in bowel habit, nausea and vomiting.  Neurological: Negative for difficulty with concentration, dizziness, focal weakness and headaches.  Psychiatric/Behavioral:  Negative for altered mental status and suicidal ideas.  All other systems reviewed and are negative.     Objective:  Blood pressure 138/77, pulse 67, temperature 98 F (36.7 C), height '5\' 8"'$  (1.727 m), weight 189 lb 3.2 oz (85.8 kg), SpO2 95 %. Body mass index is 28.77 kg/m.    Physical Exam  Constitutional: He is oriented to person, place, and time. Vital signs are normal. He appears well-developed and well-nourished.  HENT:  Head: Normocephalic and atraumatic.  Neck: Normal range of motion.  Cardiovascular: Normal rate, regular rhythm and intact distal pulses.  Murmur heard. High-pitched blowing decrescendo early diastolic murmur is present with a grade of 2/6 at the upper right sternal border radiating to the apex. S2 physiological splitting  Pulmonary/Chest: Effort normal and breath sounds normal. No accessory muscle usage. No respiratory distress.  Abdominal: Soft. Bowel sounds are normal.  Musculoskeletal: Normal range of motion.  Neurological: He is alert and oriented to person, place, and time.  Skin: Skin is warm and dry.  Vitals reviewed.  Radiology: No results found.  Laboratory examination:   11/18/2017: Creatinine 0.8, EGFR 92/111, potassium 3.9, alkaline phosphatase 36, CMP otherwise normal.  CBC normal.  Cholesterol 157, triglycerides 123, HDL 49, LDL 83.  Vitamin D low at 20.6.  Hemoglobin A1c 6.4%.  CMP Latest Ref Rng & Units 12/01/2015 11/29/2015 05/24/2011  Glucose 65 - 99 mg/dL 136(H) 190(H) 129(H)  BUN 6 - 20 mg/dL 38(H) 20 16  Creatinine 0.61 - 1.24 mg/dL 0.70 0.72 0.62  Sodium 135 - 145 mmol/L 139 137 128(L)  Potassium 3.5 - 5.1 mmol/L 4.0 4.1 4.1  Chloride 101 - 111 mmol/L 106 103 90(L)  CO2 22 - 32 mmol/L '28 24 30  '$ Calcium 8.9 - 10.3 mg/dL 8.4(L) 9.5 9.3  Total Protein 6.5 - 8.1 g/dL - 6.6 6.6  Total Bilirubin 0.3 - 1.2 mg/dL - 1.4(H) 2.1(H)  Alkaline Phos 38 - 126 U/L - 39 47  AST 15 - 41 U/L - 26 60(H)  ALT 17 - 63 U/L - 26 25   CBC Latest Ref Rng &  Units 12/02/2015 12/01/2015 11/30/2015  WBC 4.0 - 10.5 K/uL 7.7 11.5(H) -  Hemoglobin 13.0 - 17.0 g/dL 10.3(L) 11.0(L) 10.3(L)  Hematocrit 39.0 - 52.0 % 30.4(L) 31.9(L) 30.1(L)  Platelets 150 - 400 K/uL 135(L) 149(L) -   Lipid Panel  No results found for: CHOL, TRIG, HDL, CHOLHDL, VLDL, LDLCALC, LDLDIRECT HEMOGLOBIN A1C No results found for: HGBA1C, MPG TSH No results for input(s): TSH in the last 8760 hours.  PRN Meds:. Medications Discontinued During This Encounter  Medication Reason  . TOPROL XL 50 MG 24 hr tablet Change in therapy  . pantoprazole (PROTONIX) 40 MG tablet Patient Preference  . sucralfate (CARAFATE) 1 GM/10ML suspension Patient Preference   Current  Meds  Medication Sig  . acetaminophen (TYLENOL) 500 MG tablet Take 500 mg by mouth daily as needed.  Marland Kitchen amLODipine (NORVASC) 5 MG tablet daily.  Marland Kitchen aspirin EC 81 MG tablet Take 81 mg by mouth daily.  Marland Kitchen losartan (COZAAR) 50 MG tablet daily.  . metoprolol succinate (TOPROL-XL) 25 MG 24 hr tablet TAKE 1 TABLET DAILY  . nitroGLYCERIN (NITROSTAT) 0.4 MG SL tablet PLACE 1 TABLET UNDER THE TONGUE AS NEEDED FOR CHEST PAIN  . triamterene-hydrochlorothiazide (MAXZIDE-25) 37.5-25 MG tablet Take 1 tablet by mouth daily.   . Vitamin D, Ergocalciferol, (DRISDOL) 50000 units CAPS capsule Take 50,000 Units by mouth once a week. Sundays  . VYTORIN 10-40 MG tablet Take 0.5 tablets by mouth daily.     Cardiac Studies:   Echocardiogram 03/03/2019: Normal LV systolic function with EF 55%. Left ventricle cavity is normal in size. Mild concentric hypertrophy of the left ventricle. Normal global wall motion. Doppler evidence of grade I (impaired) diastolic dysfunction, normal LAP.  Trileaflet aortic valve. Mild calcification of the aortic valve annulus. Moderate (Grade II), eccentric, posteriorly directed aortic regurgitation. Mild tricuspid regurgitation. Estimated pulmonary artery systolic pressure is 24 mmHg. No significant change compared to  previous study in 2018.   Event Monitor 30 days 06/28/2017: Maximum heart rate is 108 bpm, minimum heart rate is 48 bpm, sinus. One episode of syncope and fluttering revealed normal sinus rhythm. Episodes of chest pain and fatigue revealed normal sinus rhythm. No other significant arrhythmias, no heart block.  Carotid artery duplex 06/24/2017: No hemodynamically significant arterial disease in the internal carotid artery bilaterally. Mild heteregenous plaque. Antegrade right vertebral artery flow. Antegrade left vertebral artery flow.   Nuclear stress test. 03/19/2014 : 1. Resting EKG SR with borderline first-degree AV block, left atrial enlargement, incomplete right bundle branch block. Stress EKG was non diagnostic for ischemia. No ST-T changes of ischemia noted with pharmacologic stress testing. Stress terminated due to completion of protocol. 2. The perfusion study demonstrated mild chest wall attenuation artifact in the anterior wall. There was small to moderate-sized moderate ischemia involving the basal inferior and mid inferior and inferolateral wall. Dynamic gated images reveal mild inferior wall hypokinesis. Left ventricular ejection fraction was estimated to be 63%. This represents an intermediate risk study.  Assessment:     ICD-10-CM   1. Moderate aortic regurgitation  I35.1 EKG 12-Lead  2. Essential hypertension  I10   3. Controlled type 2 diabetes mellitus without complication, without long-term current use of insulin (HCC)  E11.9   4. Palpitations  R00.2     EKG 03/11/2019: Normal sinus rhythm at 67 bpm, first degree AV block, normal axis, high lateral infarct old. IRBBB. Non specific T wave abnormality. No change compared to EKG 09/10/2018  Recommendations:   Patient is a 6 month follow-up for aortic regurgitation.  I discussed recently obtain echocardiogram results, aortic regurgitation has remained stable.  Normal LVEF.  No changes compared to 2018.  Symptomatically he is  doing well.  Physical exam is unchanged. He continues to be able to mow his yard without exertional difficulty.  No exertional chest pain or nitroglycerin use.    He does mention about the last one week palpitations suggestive of PACs or PVCs that only occur after picking blueberries in his garden that can last for several minutes to several hours.  I have discussed options of repeating his monitor for evaluation, but patient wishes to hold off for now.  Question if palpitations may be related to dehydration  as he admits that he does sweat a lot while doing this.  Encouraged him to ensure that he is staying well-hydrated and to consider drinking Pedialyte.    He is scheduled to see his PCP in September and will hold labs performed at that time.  Blood pressure is well controlled.  States that diabetes is fairly well controlled with last hemoglobin A1c of 6.8%.  I have encouraged him to contact me if palpitations do not improve, otherwise I will see him back in 6 months for follow-up.   Danny Dunn, MSN, APRN, FNP-C [facility]. [place] Office: [phone] Fax: [phone]

## 2019-06-23 ENCOUNTER — Other Ambulatory Visit: Payer: Self-pay

## 2019-06-23 ENCOUNTER — Encounter: Payer: Self-pay | Admitting: Cardiology

## 2019-06-23 ENCOUNTER — Ambulatory Visit (INDEPENDENT_AMBULATORY_CARE_PROVIDER_SITE_OTHER): Payer: Medicare Other | Admitting: Cardiology

## 2019-06-23 ENCOUNTER — Ambulatory Visit: Payer: Medicare Other

## 2019-06-23 VITALS — BP 143/69 | HR 53 | Temp 97.7°F | Ht 68.0 in | Wt 185.0 lb

## 2019-06-23 DIAGNOSIS — R002 Palpitations: Secondary | ICD-10-CM

## 2019-06-23 DIAGNOSIS — I351 Nonrheumatic aortic (valve) insufficiency: Secondary | ICD-10-CM

## 2019-06-23 NOTE — Progress Notes (Signed)
Primary Physician:  Leanna Battles, MD   Patient ID: [name], male    DOB: 1930-11-27, 85 y.o.   MRN: [id number]  Subjective:    Chief Complaint  Patient presents with  . abn heartrate  . Follow-up    HPI: SABASTIEN Trujillo  is a 85 y.o. male  with type II diabetes mellitus,moderate aortic regurgitation, previous episodes of syncope felt to be possibly related to sick sinus syndrome and Metoprolol succinate was decreased from 50 mg to 25 mg. No recurrence since.   Patient made an appointment today to see me due to palpitations. Patient reports that on Sunday, he began noticing palpitations described as flip-flopping sensation. He has mentioned these before, but were occurring infrequently and episodes were brief. He was concerned because this episode lasted for 3 days and states that they spontaneously resolved prior to his appointment with me today. No associated dizziness, lightheadedness, shortness of breath, or syncope.    He was recently seen for follow up on aortic regurgitation that is stable by his recent echo. Blood pressure has remained well controlled. He reports that hyperlipidemia is well controlled. Diabetes is diet controlled, last HgbA1c 6.8%. Denies any shortness of breath or chest pain. He continues to mow his yard that is 2-3 acres without difficulty.  Past Medical History:  Diagnosis Date  . HOH (hard of hearing)   . Hypertension     Past Surgical History:  Procedure Laterality Date  . ESOPHAGOGASTRODUODENOSCOPY N/A 11/30/2015   Procedure: ESOPHAGOGASTRODUODENOSCOPY (EGD);  Surgeon: Teena Irani, MD;  [facility];  Service: Endoscopy;  Laterality: N/A;  . NO PAST SURGERIES      Social History   Socioeconomic History  . Marital status: Widowed    Spouse name: Not on file  . Number of children: 1  . Years of education: Not on file  . Highest education level: Not on file  Occupational History  . Not on file  Social Needs  . Financial resource  strain: Not on file  . Food insecurity    Worry: Not on file    Inability: Not on file  . Transportation needs    Medical: Not on file    Non-medical: Not on file  Tobacco Use  . Smoking status: Former Smoker    Packs/day: 0.25    Years: 50.00    Pack years: 12.50    Types: Cigarettes, Pipe    Quit date: 03/10/1994    Years since quitting: 25.3  . Smokeless tobacco: Former Systems developer    Quit date: 11/30/1995  Substance and Sexual Activity  . Alcohol use: No  . Drug use: No  . Sexual activity: Never  Lifestyle  . Physical activity    Days per week: Not on file    Minutes per session: Not on file  . Stress: Not on file  Relationships  . Social Herbalist on phone: Not on file    Gets together: Not on file    Attends religious service: Not on file    Active member of club or organization: Not on file    Attends meetings of clubs or organizations: Not on file    Relationship status: Not on file  . Intimate partner violence    Fear of current or ex partner: Not on file    Emotionally abused: Not on file    Physically abused: Not on file    Forced sexual activity: Not on file  Other Topics Concern  .  Not on file  Social History Narrative  . Not on file    Review of Systems  Constitution: Negative for decreased appetite, malaise/fatigue, weight gain and weight loss.  Eyes: Negative for visual disturbance.  Cardiovascular: Positive for palpitations. Negative for chest pain, claudication, dyspnea on exertion, leg swelling, orthopnea and syncope.  Respiratory: Negative for hemoptysis and wheezing.   Endocrine: Negative for cold intolerance and heat intolerance.  Hematologic/Lymphatic: Does not bruise/bleed easily.  Skin: Negative for nail changes.  Musculoskeletal: Positive for back pain. Negative for muscle weakness and myalgias.  Gastrointestinal: Negative for abdominal pain, change in bowel habit, nausea and vomiting.  Neurological: Negative for difficulty with  concentration, dizziness, focal weakness and headaches.  Psychiatric/Behavioral: Negative for altered mental status and suicidal ideas.  All other systems reviewed and are negative.     Objective:  Blood pressure (!) 143/69, pulse (!) 53, temperature 97.7 F (36.5 C), height '5\' 8"'$  (1.727 m), weight 185 lb (83.9 kg), SpO2 97 %. Body mass index is 28.13 kg/m.    Physical Exam  Constitutional: He is oriented to person, place, and time. Vital signs are normal. He appears well-developed and well-nourished.  HENT:  Head: Normocephalic and atraumatic.  Neck: Normal range of motion.  Cardiovascular: Normal rate, regular rhythm and intact distal pulses.  Murmur heard. High-pitched blowing decrescendo early diastolic murmur is present with a grade of 2/6 at the upper right sternal border radiating to the apex. S2 physiological splitting  Pulmonary/Chest: Effort normal and breath sounds normal. No accessory muscle usage. No respiratory distress.  Abdominal: Soft. Bowel sounds are normal.  Musculoskeletal: Normal range of motion.  Neurological: He is alert and oriented to person, place, and time.  Skin: Skin is warm and dry.  Vitals reviewed.  Radiology: No results found.  Laboratory examination:   11/18/2017: Creatinine 0.8, EGFR 92/111, potassium 3.9, alkaline phosphatase 36, CMP otherwise normal.  CBC normal.  Cholesterol 157, triglycerides 123, HDL 49, LDL 83.  Vitamin D low at 20.6.  Hemoglobin A1c 6.4%.  CMP Latest Ref Rng & Units 12/01/2015 11/29/2015 05/24/2011  Glucose 65 - 99 mg/dL 136(H) 190(H) 129(H)  BUN 6 - 20 mg/dL 38(H) 20 16  Creatinine 0.61 - 1.24 mg/dL 0.70 0.72 0.62  Sodium 135 - 145 mmol/L 139 137 128(L)  Potassium 3.5 - 5.1 mmol/L 4.0 4.1 4.1  Chloride 101 - 111 mmol/L 106 103 90(L)  CO2 22 - 32 mmol/L '28 24 30  '$ Calcium 8.9 - 10.3 mg/dL 8.4(L) 9.5 9.3  Total Protein 6.5 - 8.1 g/dL - 6.6 6.6  Total Bilirubin 0.3 - 1.2 mg/dL - 1.4(H) 2.1(H)  Alkaline Phos 38 - 126 U/L -  39 47  AST 15 - 41 U/L - 26 60(H)  ALT 17 - 63 U/L - 26 25   CBC Latest Ref Rng & Units 12/02/2015 12/01/2015 11/30/2015  WBC 4.0 - 10.5 K/uL 7.7 11.5(H) -  Hemoglobin 13.0 - 17.0 g/dL 10.3(L) 11.0(L) 10.3(L)  Hematocrit 39.0 - 52.0 % 30.4(L) 31.9(L) 30.1(L)  Platelets 150 - 400 K/uL 135(L) 149(L) -   Lipid Panel  No results found for: CHOL, TRIG, HDL, CHOLHDL, VLDL, LDLCALC, LDLDIRECT HEMOGLOBIN A1C No results found for: HGBA1C, MPG TSH No results for input(s): TSH in the last 8760 hours.  PRN Meds:. There are no discontinued medications. Current Meds  Medication Sig  . acetaminophen (TYLENOL) 500 MG tablet Take 500 mg by mouth daily as needed.  Marland Kitchen amLODipine (NORVASC) 5 MG tablet daily.  Marland Kitchen aspirin EC  81 MG tablet Take 81 mg by mouth daily.  Marland Kitchen losartan (COZAAR) 50 MG tablet daily.  . metoprolol succinate (TOPROL-XL) 25 MG 24 hr tablet TAKE 1 TABLET DAILY  . nitroGLYCERIN (NITROSTAT) 0.4 MG SL tablet PLACE 1 TABLET UNDER THE TONGUE AS NEEDED FOR CHEST PAIN  . triamterene-hydrochlorothiazide (MAXZIDE-25) 37.5-25 MG tablet Take 1 tablet by mouth daily.   . Vitamin D, Ergocalciferol, (DRISDOL) 50000 units CAPS capsule Take 50,000 Units by mouth once a week. Sundays  . VYTORIN 10-40 MG tablet Take 0.5 tablets by mouth daily.     Cardiac Studies:   Echocardiogram 03/03/2019: Normal LV systolic function with EF 55%. Left ventricle cavity is normal in size. Mild concentric hypertrophy of the left ventricle. Normal global wall motion. Doppler evidence of grade I (impaired) diastolic dysfunction, normal LAP.  Trileaflet aortic valve. Mild calcification of the aortic valve annulus. Moderate (Grade II), eccentric, posteriorly directed aortic regurgitation. Mild tricuspid regurgitation. Estimated pulmonary artery systolic pressure is 24 mmHg. No significant change compared to previous study in 2018.   Event Monitor 30 days 06/28/2017: Maximum heart rate is 108 bpm, minimum heart rate is 48 bpm,  sinus. One episode of syncope and fluttering revealed normal sinus rhythm. Episodes of chest pain and fatigue revealed normal sinus rhythm. No other significant arrhythmias, no heart block.  Carotid artery duplex 06/24/2017: No hemodynamically significant arterial disease in the internal carotid artery bilaterally. Mild heteregenous plaque. Antegrade right vertebral artery flow. Antegrade left vertebral artery flow.   Nuclear stress test. 03/19/2014 : 1. Resting EKG SR with borderline first-degree AV block, left atrial enlargement, incomplete right bundle branch block. Stress EKG was non diagnostic for ischemia. No ST-T changes of ischemia noted with pharmacologic stress testing. Stress terminated due to completion of protocol. 2. The perfusion study demonstrated mild chest wall attenuation artifact in the anterior wall. There was small to moderate-sized moderate ischemia involving the basal inferior and mid inferior and inferolateral wall. Dynamic gated images reveal mild inferior wall hypokinesis. Left ventricular ejection fraction was estimated to be 63%. This represents an intermediate risk study.  Assessment:     ICD-10-CM   1. Moderate aortic regurgitation  I35.1   2. Palpitations  R00.2 EKG 12-Lead    EKG 06/23/2019: Normal sinus rhythm at 61 bpm, first degree AV block, normal axis, high lateral infarct old. IRBBB. Non specific T wave abnormality. No change compared to EKG 03/11/2019  Recommendations:   Patient is here for acute visit for palpitations.  Patient has recently had an episode of palpitations that lasted for approximately 3 days.  His symptoms of palpitations seem to be more frequent and of longer duration.  I will place him on 30-day event monitor for further evaluation.  His symptoms are suggestive of PVCs; however, he is previously had episodes of sick sinus syndrome and associated syncope.  He is without any worsening shortness of breath, leg edema, or chest pain.  Aortic  regurgitation has been stable by recent echo in July 2020.  No changes were made to his medications today.  I will plan to see him back after his event monitor to discuss results and for further recommendations.  Miquel Dunn, MSN, APRN, FNP-C [facility]. [place] Office: [phone] Fax: [phone]

## 2019-06-24 ENCOUNTER — Encounter: Payer: Self-pay | Admitting: Cardiology

## 2019-07-30 NOTE — Progress Notes (Signed)
Primary Physician:  Leanna Battles, MD   Patient ID: [name], male    DOB: Jan 31, 1931, 85 y.o.   MRN: [id number]  Subjective:    Chief Complaint  Patient presents with  . Palpitations  . Follow-up    HPI: [name]  is a 85 y.o. male  with type II diabetes mellitus,moderate aortic regurgitation, previous episodes of syncope felt to be possibly related to sick sinus syndrome and Metoprolol succinate was decreased from 50 mg to 25 mg. No recurrence since.   Patient was recently seen for acute visit after having 3 days of palpitations that he describes as flip-flopping sensation.  He was placed on 30-day event monitor and now presents for follow-up.  He has not had any recurrence of palpitations.  He is overall feeling well.  He was recently seen for follow up on aortic regurgitation that is stable by his recent echo. Blood pressure has remained well controlled. He reports that hyperlipidemia is well controlled. Diabetes is diet controlled, last HgbA1c 6.8%. Denies any shortness of breath or chest pain. He continues to mow his yard that is 2-3 acres without difficulty.  Past Medical History:  Diagnosis Date  . HOH (hard of hearing)   . Hypertension     Past Surgical History:  Procedure Laterality Date  . ESOPHAGOGASTRODUODENOSCOPY N/A 11/30/2015   Procedure: ESOPHAGOGASTRODUODENOSCOPY (EGD);  Surgeon: Teena Irani, MD;  Location: [facility];  Service: Endoscopy;  Laterality: N/A;  . NO PAST SURGERIES      Social History   Socioeconomic History  . Marital status: Widowed    Spouse name: Not on file  . Number of children: 1  . Years of education: Not on file  . Highest education level: Not on file  Occupational History  . Not on file  Social Needs  . Financial resource strain: Not on file  . Food insecurity    Worry: Not on file    Inability: Not on file  . Transportation needs    Medical: Not on file    Non-medical: Not on file  Tobacco Use  . Smoking  status: Former Smoker    Packs/day: 0.25    Years: 50.00    Pack years: 12.50    Types: Cigarettes, Pipe    Quit date: 03/10/1994    Years since quitting: 25.4  . Smokeless tobacco: Former Systems developer    Quit date: 11/30/1995  Substance and Sexual Activity  . Alcohol use: No  . Drug use: No  . Sexual activity: Never  Lifestyle  . Physical activity    Days per week: Not on file    Minutes per session: Not on file  . Stress: Not on file  Relationships  . Social Herbalist on phone: Not on file    Gets together: Not on file    Attends religious service: Not on file    Active member of club or organization: Not on file    Attends meetings of clubs or organizations: Not on file    Relationship status: Not on file  . Intimate partner violence    Fear of current or ex partner: Not on file    Emotionally abused: Not on file    Physically abused: Not on file    Forced sexual activity: Not on file  Other Topics Concern  . Not on file  Social History Narrative  . Not on file    Review of Systems  Constitution: Negative for decreased appetite,  malaise/fatigue, weight gain and weight loss.  Eyes: Negative for visual disturbance.  Cardiovascular: Positive for palpitations. Negative for chest pain, claudication, dyspnea on exertion, leg swelling, orthopnea and syncope.  Respiratory: Negative for hemoptysis and wheezing.   Endocrine: Negative for cold intolerance and heat intolerance.  Hematologic/Lymphatic: Does not bruise/bleed easily.  Skin: Negative for nail changes.  Musculoskeletal: Positive for back pain. Negative for muscle weakness and myalgias.  Gastrointestinal: Negative for abdominal pain, change in bowel habit, nausea and vomiting.  Neurological: Negative for difficulty with concentration, dizziness, focal weakness and headaches.  Psychiatric/Behavioral: Negative for altered mental status and suicidal ideas.  All other systems reviewed and are negative.     Objective:   Blood pressure 135/61, pulse 65, temperature (!) 97.2 F (36.2 C), height '5\' 8"'$  (1.727 m), weight 181 lb (82.1 kg), SpO2 97 %. Body mass index is 27.52 kg/m.    Physical Exam  Constitutional: He is oriented to person, place, and time. Vital signs are normal. He appears well-developed and well-nourished.  HENT:  Head: Normocephalic and atraumatic.  Neck: Normal range of motion.  Cardiovascular: Normal rate, regular rhythm and intact distal pulses.  Murmur heard. High-pitched blowing decrescendo early diastolic murmur is present with a grade of 2/6 at the upper right sternal border radiating to the apex. S2 physiological splitting  Pulmonary/Chest: Effort normal and breath sounds normal. No accessory muscle usage. No respiratory distress.  Abdominal: Soft. Bowel sounds are normal.  Musculoskeletal: Normal range of motion.  Neurological: He is alert and oriented to person, place, and time.  Skin: Skin is warm and dry.  Vitals reviewed.  Radiology: No results found.  Laboratory examination:   11/18/2017: Creatinine 0.8, EGFR 92/111, potassium 3.9, alkaline phosphatase 36, CMP otherwise normal.  CBC normal.  Cholesterol 157, triglycerides 123, HDL 49, LDL 83.  Vitamin D low at 20.6.  Hemoglobin A1c 6.4%.  CMP Latest Ref Rng & Units 12/01/2015 11/29/2015 05/24/2011  Glucose 65 - 99 mg/dL 136(H) 190(H) 129(H)  BUN 6 - 20 mg/dL 38(H) 20 16  Creatinine 0.61 - 1.24 mg/dL 0.70 0.72 0.62  Sodium 135 - 145 mmol/L 139 137 128(L)  Potassium 3.5 - 5.1 mmol/L 4.0 4.1 4.1  Chloride 101 - 111 mmol/L 106 103 90(L)  CO2 22 - 32 mmol/L '28 24 30  '$ Calcium 8.9 - 10.3 mg/dL 8.4(L) 9.5 9.3  Total Protein 6.5 - 8.1 g/dL - 6.6 6.6  Total Bilirubin 0.3 - 1.2 mg/dL - 1.4(H) 2.1(H)  Alkaline Phos 38 - 126 U/L - 39 47  AST 15 - 41 U/L - 26 60(H)  ALT 17 - 63 U/L - 26 25   CBC Latest Ref Rng & Units 12/02/2015 12/01/2015 11/30/2015  WBC 4.0 - 10.5 K/uL 7.7 11.5(H) -  Hemoglobin 13.0 - 17.0 g/dL 10.3(L) 11.0(L)  10.3(L)  Hematocrit 39.0 - 52.0 % 30.4(L) 31.9(L) 30.1(L)  Platelets 150 - 400 K/uL 135(L) 149(L) -   Lipid Panel  No results found for: CHOL, TRIG, HDL, CHOLHDL, VLDL, LDLCALC, LDLDIRECT HEMOGLOBIN A1C No results found for: HGBA1C, MPG TSH No results for input(s): TSH in the last 8760 hours.  PRN Meds:. There are no discontinued medications. Current Meds  Medication Sig  . acetaminophen (TYLENOL) 500 MG tablet Take 500 mg by mouth daily as needed.  Marland Kitchen amLODipine (NORVASC) 5 MG tablet daily.  Marland Kitchen aspirin EC 81 MG tablet Take 81 mg by mouth daily.  Marland Kitchen losartan (COZAAR) 50 MG tablet daily.  . metoprolol succinate (TOPROL-XL) 25 MG 24  hr tablet TAKE 1 TABLET DAILY  . nitroGLYCERIN (NITROSTAT) 0.4 MG SL tablet PLACE 1 TABLET UNDER THE TONGUE AS NEEDED FOR CHEST PAIN  . triamterene-hydrochlorothiazide (MAXZIDE-25) 37.5-25 MG tablet Take 1 tablet by mouth daily.   . Vitamin D, Ergocalciferol, (DRISDOL) 50000 units CAPS capsule Take 50,000 Units by mouth once a week. Sundays  . VYTORIN 10-40 MG tablet Take 0.5 tablets by mouth daily.     Cardiac Studies:   30 day event monitor 10/27-11/26/20: Normal sinus rhythm. 1 auto detected event occurred on day 18 at 0627 for NSR with first degree AV block. No patient triggered events occurred. Min HR was 49 bpm on day 19 at 0626. No A fib or high degree AV block was noted.   Echocardiogram 03/03/2019: Normal LV systolic function with EF 55%. Left ventricle cavity is normal in size. Mild concentric hypertrophy of the left ventricle. Normal global wall motion. Doppler evidence of grade I (impaired) diastolic dysfunction, normal LAP.  Trileaflet aortic valve. Mild calcification of the aortic valve annulus. Moderate (Grade II), eccentric, posteriorly directed aortic regurgitation. Mild tricuspid regurgitation. Estimated pulmonary artery systolic pressure is 24 mmHg. No significant change compared to previous study in 2018.   Event Monitor 30 days  06/28/2017: Maximum heart rate is 108 bpm, minimum heart rate is 48 bpm, sinus. One episode of syncope and fluttering revealed normal sinus rhythm. Episodes of chest pain and fatigue revealed normal sinus rhythm. No other significant arrhythmias, no heart block.  Carotid artery duplex 06/24/2017: No hemodynamically significant arterial disease in the internal carotid artery bilaterally. Mild heteregenous plaque. Antegrade right vertebral artery flow. Antegrade left vertebral artery flow.   Nuclear stress test. 03/19/2014 : 1. Resting EKG SR with borderline first-degree AV block, left atrial enlargement, incomplete right bundle branch block. Stress EKG was non diagnostic for ischemia. No ST-T changes of ischemia noted with pharmacologic stress testing. Stress terminated due to completion of protocol. 2. The perfusion study demonstrated mild chest wall attenuation artifact in the anterior wall. There was small to moderate-sized moderate ischemia involving the basal inferior and mid inferior and inferolateral wall. Dynamic gated images reveal mild inferior wall hypokinesis. Left ventricular ejection fraction was estimated to be 63%. This represents an intermediate risk study.  Assessment:     ICD-10-CM   1. Palpitations  R00.2   2. Moderate aortic regurgitation  I35.1   3. Essential hypertension  I10     EKG 06/23/2019: Normal sinus rhythm at 61 bpm, first degree AV block, normal axis, high lateral infarct old. IRBBB. Non specific T wave abnormality. No change compared to EKG 03/11/2019  Recommendations:   I discussed recent 30-day event monitor findings with the patient, no arrhythmias were noted.  He has had resolution in palpitations since last seen by me.  His palpitations were suggestive of PVCs.  I have encouraged him to contact me should he have any recurrence, and will consider repeating event monitor.  He does have a history of sick sinus syndrome that improved with decreasing his dose  of metoprolol.  Blood pressures well controlled.  No changes were made to medications today.  By echocardiogram performed in July, aortic regurgitation has been stable.  I will move his previously scheduled office visit for next month until 2 to 3 months for follow-up on palpitations and aortic regurgitation.  Miquel Dunn, MSN, APRN, FNP-C [facility]. [place] Office: [phone] Fax: [phone]

## 2019-07-31 ENCOUNTER — Encounter: Payer: Self-pay | Admitting: Cardiology

## 2019-07-31 ENCOUNTER — Other Ambulatory Visit: Payer: Self-pay

## 2019-07-31 ENCOUNTER — Ambulatory Visit (INDEPENDENT_AMBULATORY_CARE_PROVIDER_SITE_OTHER): Payer: Medicare Other | Admitting: Cardiology

## 2019-07-31 VITALS — BP 135/61 | HR 65 | Temp 97.2°F | Ht 68.0 in | Wt 181.0 lb

## 2019-07-31 DIAGNOSIS — I351 Nonrheumatic aortic (valve) insufficiency: Secondary | ICD-10-CM | POA: Diagnosis not present

## 2019-07-31 DIAGNOSIS — I10 Essential (primary) hypertension: Secondary | ICD-10-CM

## 2019-07-31 DIAGNOSIS — R002 Palpitations: Secondary | ICD-10-CM

## 2019-09-07 ENCOUNTER — Telehealth: Payer: Self-pay

## 2019-09-07 ENCOUNTER — Other Ambulatory Visit: Payer: Self-pay

## 2019-09-07 MED ORDER — AMLODIPINE BESYLATE 5 MG PO TABS: 5.0000 mg | ORAL_TABLET | Freq: Every day | ORAL | 1 refills | Status: DC

## 2019-09-07 MED ORDER — LOSARTAN POTASSIUM 50 MG PO TABS: 50.0000 mg | ORAL_TABLET | Freq: Every day | ORAL | 1 refills | Status: DC

## 2019-09-07 NOTE — Telephone Encounter (Signed)
done

## 2019-09-14 ENCOUNTER — Ambulatory Visit: Payer: Medicare Other | Admitting: Cardiology

## 2019-10-30 ENCOUNTER — Other Ambulatory Visit: Payer: Self-pay

## 2019-10-30 ENCOUNTER — Ambulatory Visit: Payer: Medicare Other | Admitting: Cardiology

## 2019-10-30 ENCOUNTER — Encounter: Payer: Self-pay | Admitting: Cardiology

## 2019-10-30 VITALS — BP 132/67 | HR 60 | Temp 97.7°F | Ht 68.0 in | Wt 184.0 lb

## 2019-10-30 DIAGNOSIS — I10 Essential (primary) hypertension: Secondary | ICD-10-CM

## 2019-10-30 DIAGNOSIS — I351 Nonrheumatic aortic (valve) insufficiency: Secondary | ICD-10-CM

## 2019-10-30 NOTE — Progress Notes (Signed)
Primary Physician:  Leanna Battles, MD   Patient ID: [name], male    DOB: [birth year], 86 y.o.   MRN: [id number]  Subjective:    Chief Complaint  Patient presents with  . Palpitations    pt has no complaints   . Mitral Regurgitation    HPI: [name]  is a 86 y.o. male  with type II diabetes mellitus,moderate aortic regurgitation, previous episodes of syncope felt to be possibly related to sick sinus syndrome and Metoprolol succinate was decreased from 50 mg to 25 mg. No recurrence since.   He was last seen 3 months ago, he had complained of palpitations, underwent a 30-day event monitor that was without any arrhythmias.  He has not had any recurrence of palpitations.  He now presents for follow-up.  Aortic regurgitation is stable by echo in July 2020. Blood pressure has remained well controlled. He reports that hyperlipidemia is well controlled. Diabetes is diet controlled, last HgbA1c 6.8%. Denies any shortness of breath or chest pain. No dizziness.  Past Medical History:  Diagnosis Date  . HOH (hard of hearing)   . Hypertension     Past Surgical History:  Procedure Laterality Date  . ESOPHAGOGASTRODUODENOSCOPY N/A 11/30/2015   Procedure: ESOPHAGOGASTRODUODENOSCOPY (EGD);  Surgeon: Teena Irani, MD;  Location: [facility];  Service: Endoscopy;  Laterality: N/A;  . NO PAST SURGERIES      Social History   Tobacco Use  . Smoking status: Former Smoker    Packs/day: 0.25    Years: 50.00    Pack years: 12.50    Types: Cigarettes, Pipe    Quit date: 03/10/1994    Years since quitting: 25.6  . Smokeless tobacco: Former Systems developer    Quit date: 11/30/1995  Substance Use Topics  . Alcohol use: No    Review of Systems  Constitution: Negative for decreased appetite, malaise/fatigue, weight gain and weight loss.  Eyes: Negative for visual disturbance.  Cardiovascular: Negative for chest pain, claudication, dyspnea on exertion, leg swelling, orthopnea, palpitations and  syncope.  Respiratory: Negative for hemoptysis and wheezing.   Endocrine: Negative for cold intolerance and heat intolerance.  Hematologic/Lymphatic: Does not bruise/bleed easily.  Skin: Negative for nail changes.  Musculoskeletal: Positive for back pain. Negative for muscle weakness and myalgias.  Gastrointestinal: Negative for abdominal pain, change in bowel habit, nausea and vomiting.  Neurological: Negative for difficulty with concentration, dizziness, focal weakness and headaches.  Psychiatric/Behavioral: Negative for altered mental status and suicidal ideas.  All other systems reviewed and are negative.     Objective:  Blood pressure 132/67, pulse 60, temperature 97.7 F (36.5 C), height '5\' 8"'$  (1.727 m), weight 184 lb (83.5 kg), SpO2 96 %. Body mass index is 27.98 kg/m.    Physical Exam  Constitutional: He is oriented to person, place, and time. Vital signs are normal. He appears well-developed and well-nourished.  HENT:  Head: Normocephalic and atraumatic.  Cardiovascular: Normal rate, regular rhythm and intact distal pulses.  Murmur heard. High-pitched blowing decrescendo early diastolic murmur is present with a grade of 2/6 at the upper right sternal border radiating to the apex. S2 physiological splitting  Pulmonary/Chest: Effort normal and breath sounds normal. No accessory muscle usage. No respiratory distress.  Abdominal: Soft. Bowel sounds are normal.  Musculoskeletal:        General: Normal range of motion.     Cervical back: Normal range of motion.  Neurological: He is alert and oriented to person, place, and time.  Skin:  Skin is warm and dry.  Vitals reviewed.  Radiology: No results found.  Laboratory examination:   HDL 47 MG/DL 05/01/2019 LDL 78.000 mg 05/01/2019 Cholesterol, total 142.000 m 05/01/2019 Triglycerides 85.000 05/01/2019 A1C 6.800 % 05/01/2019 Glucose Random N 05/08/2019 MicroAlbumin Urine 6.000 05/08/2019 MicroAlbumin/Creat 7.6 MG/DL 05/08/2019 BUN  13.000 mg 05/01/2019 Creatinine, Serum 0.900 mg/ 05/01/2019  11/18/2017: Creatinine 0.8, EGFR 92/111, potassium 3.9, alkaline phosphatase 36, CMP otherwise normal.  CBC normal.  Cholesterol 157, triglycerides 123, HDL 49, LDL 83.  Vitamin D low at 20.6.  Hemoglobin A1c 6.4%.  CMP Latest Ref Rng & Units 12/01/2015 11/29/2015 05/24/2011  Glucose 65 - 99 mg/dL 136(H) 190(H) 129(H)  BUN 6 - 20 mg/dL 38(H) 20 16  Creatinine 0.61 - 1.24 mg/dL 0.70 0.72 0.62  Sodium 135 - 145 mmol/L 139 137 128(L)  Potassium 3.5 - 5.1 mmol/L 4.0 4.1 4.1  Chloride 101 - 111 mmol/L 106 103 90(L)  CO2 22 - 32 mmol/L '28 24 30  '$ Calcium 8.9 - 10.3 mg/dL 8.4(L) 9.5 9.3  Total Protein 6.5 - 8.1 g/dL - 6.6 6.6  Total Bilirubin 0.3 - 1.2 mg/dL - 1.4(H) 2.1(H)  Alkaline Phos 38 - 126 U/L - 39 47  AST 15 - 41 U/L - 26 60(H)  ALT 17 - 63 U/L - 26 25   CBC Latest Ref Rng & Units 12/02/2015 12/01/2015 11/30/2015  WBC 4.0 - 10.5 K/uL 7.7 11.5(H) -  Hemoglobin 13.0 - 17.0 g/dL 10.3(L) 11.0(L) 10.3(L)  Hematocrit 39.0 - 52.0 % 30.4(L) 31.9(L) 30.1(L)  Platelets 150 - 400 K/uL 135(L) 149(L) -   Lipid Panel  No results found for: CHOL, TRIG, HDL, CHOLHDL, VLDL, LDLCALC, LDLDIRECT HEMOGLOBIN A1C No results found for: HGBA1C, MPG TSH No results for input(s): TSH in the last 8760 hours.  PRN Meds:. There are no discontinued medications. Current Meds  Medication Sig  . acetaminophen (TYLENOL) 500 MG tablet Take 500 mg by mouth daily as needed.  Marland Kitchen amLODipine (NORVASC) 5 MG tablet Take 1 tablet (5 mg total) by mouth daily.  Marland Kitchen aspirin EC 81 MG tablet Take 81 mg by mouth daily.  Marland Kitchen losartan (COZAAR) 50 MG tablet Take 1 tablet (50 mg total) by mouth daily.  . metoprolol succinate (TOPROL-XL) 25 MG 24 hr tablet TAKE 1 TABLET DAILY  . nitroGLYCERIN (NITROSTAT) 0.4 MG SL tablet PLACE 1 TABLET UNDER THE TONGUE AS NEEDED FOR CHEST PAIN  . triamterene-hydrochlorothiazide (MAXZIDE-25) 37.5-25 MG tablet Take 1 tablet by mouth daily.   . Vitamin D,  Ergocalciferol, (DRISDOL) 50000 units CAPS capsule Take 50,000 Units by mouth once a week. Sundays  . VYTORIN 10-40 MG tablet Take 0.5 tablets by mouth daily.     Cardiac Studies:   30 day event monitor 10/27-11/26/20: Normal sinus rhythm. 1 auto detected event occurred on day 18 at 0627 for NSR with first degree AV block. No patient triggered events occurred. Min HR was 49 bpm on day 19 at 0626. No A fib or high degree AV block was noted.   Echocardiogram 03/03/2019: Normal LV systolic function with EF 55%. Left ventricle cavity is normal in size. Mild concentric hypertrophy of the left ventricle. Normal global wall motion. Doppler evidence of grade I (impaired) diastolic dysfunction, normal LAP.  Trileaflet aortic valve. Mild calcification of the aortic valve annulus. Moderate (Grade II), eccentric, posteriorly directed aortic regurgitation. Mild tricuspid regurgitation. Estimated pulmonary artery systolic pressure is 24 mmHg. No significant change compared to previous study in 2018.   Event Monitor  30 days 06/28/2017: Maximum heart rate is 108 bpm, minimum heart rate is 48 bpm, sinus. One episode of syncope and fluttering revealed normal sinus rhythm. Episodes of chest pain and fatigue revealed normal sinus rhythm. No other significant arrhythmias, no heart block.  Carotid artery duplex 06/24/2017: No hemodynamically significant arterial disease in the internal carotid artery bilaterally. Mild heteregenous plaque. Antegrade right vertebral artery flow. Antegrade left vertebral artery flow.   Nuclear stress test. 03/19/2014 : 1. Resting EKG SR with borderline first-degree AV block, left atrial enlargement, incomplete right bundle branch block. Stress EKG was non diagnostic for ischemia. No ST-T changes of ischemia noted with pharmacologic stress testing. Stress terminated due to completion of protocol. 2. The perfusion study demonstrated mild chest wall attenuation artifact in the anterior  wall. There was small to moderate-sized moderate ischemia involving the basal inferior and mid inferior and inferolateral wall. Dynamic gated images reveal mild inferior wall hypokinesis. Left ventricular ejection fraction was estimated to be 63%. This represents an intermediate risk study.  Assessment:     ICD-10-CM   1. Essential hypertension  I10   2. Moderate aortic regurgitation  I35.1     EKG 06/23/2019: Normal sinus rhythm at 61 bpm, first degree AV block, normal axis, high lateral infarct old. IRBBB. Non specific T wave abnormality. No change compared to EKG 03/11/2019  Recommendations:   Patient is here for 54-monthfollow-up.  He is doing well he has not had any recurrence of palpitations.  We will continue with watchful waiting.  Suspect his previous episodes of palpitations were related to PVCs.  His blood pressure remains well controlled.  Continue with his present medications.  In regard to aortic regurgitation, has been stable by his echocardiogram that was last performed in July 2020.  No clinical evidence of decompensated heart failure.  No changes by physical exam. Do not feel that he needs repeat echocardiogram at this time.I will plan to see him back in 6 months for follow-up, but encouraged him to contact me sooner if needed.    AMiquel Dunn MSN, APRN, FNP-C [facility]. [place]eneOffice: [phone]: [phone]

## 2020-02-08 ENCOUNTER — Other Ambulatory Visit: Payer: Self-pay

## 2020-02-08 ENCOUNTER — Encounter (INDEPENDENT_AMBULATORY_CARE_PROVIDER_SITE_OTHER): Payer: Self-pay | Admitting: Otolaryngology

## 2020-02-08 ENCOUNTER — Ambulatory Visit (INDEPENDENT_AMBULATORY_CARE_PROVIDER_SITE_OTHER): Payer: Medicare Other | Admitting: Otolaryngology

## 2020-02-08 VITALS — Temp 97.3°F

## 2020-02-08 DIAGNOSIS — H903 Sensorineural hearing loss, bilateral: Secondary | ICD-10-CM | POA: Diagnosis not present

## 2020-02-08 DIAGNOSIS — H6123 Impacted cerumen, bilateral: Secondary | ICD-10-CM

## 2020-02-08 NOTE — Progress Notes (Signed)
HPI: [name] is a 86 y.o. male who presents for evaluation of wax buildup in both ears.  He wears bilateral hearing aids..  Past Medical History:  Diagnosis Date  . HOH (hard of hearing)   . Hypertension    Past Surgical History:  Procedure Laterality Date  . ESOPHAGOGASTRODUODENOSCOPY N/A 11/30/2015   Procedure: ESOPHAGOGASTRODUODENOSCOPY (EGD);  Surgeon: Dorena Cookey, MD;  Location: [facility];  Service: Endoscopy;  Laterality: N/A;  . NO PAST SURGERIES     Social History   Socioeconomic History  . Marital status: Widowed    Spouse name: Not on file  . Number of children: 1  . Years of education: Not on file  . Highest education level: Not on file  Occupational History  . Not on file  Tobacco Use  . Smoking status: Former Smoker    Packs/day: 0.25    Years: 50.00    Pack years: 12.50    Types: Cigarettes, Pipe    Quit date: 03/10/1994    Years since quitting: 25.9  . Smokeless tobacco: Former Neurosurgeon    Quit date: 11/30/1995  Substance and Sexual Activity  . Alcohol use: No  . Drug use: No  . Sexual activity: Never  Other Topics Concern  . Not on file  Social History Narrative  . Not on file   Social Determinants of Health   Financial Resource Strain:   . Difficulty of Paying Living Expenses:   Food Insecurity:   . Worried About Programme researcher, broadcasting/film/video in the Last Year:   . Barista in the Last Year:   Transportation Needs:   . Freight forwarder (Medical):   Marland Kitchen Lack of Transportation (Non-Medical):   Physical Activity:   . Days of Exercise per Week:   . Minutes of Exercise per Session:   Stress:   . Feeling of Stress :   Social Connections:   . Frequency of Communication with Friends and Family:   . Frequency of Social Gatherings with Friends and Family:   . Attends Religious Services:   . Active Member of Clubs or Organizations:   . Attends Banker Meetings:   Marland Kitchen Marital Status:    No family history on file. No Known  Allergies Prior to Admission medications   Medication Sig Start Date End Date Taking? Authorizing Provider  acetaminophen (TYLENOL) 500 MG tablet Take 500 mg by mouth daily as needed.   Yes [provider]  amLODipine (NORVASC) 5 MG tablet Take 1 tablet (5 mg total) by mouth daily. 09/07/19  Yes Toniann Fail, NP  aspirin EC 81 MG tablet Take 81 mg by mouth daily.   Yes [provider]  losartan (COZAAR) 50 MG tablet Take 1 tablet (50 mg total) by mouth daily. 09/07/19  Yes Toniann Fail, NP  metoprolol succinate (TOPROL-XL) 25 MG 24 hr tablet TAKE 1 TABLET DAILY 10/28/18  Yes Yates Decamp, MD  nitroGLYCERIN (NITROSTAT) 0.4 MG SL tablet PLACE 1 TABLET UNDER THE TONGUE AS NEEDED FOR CHEST PAIN 11/03/15  Yes [provider]  triamterene-hydrochlorothiazide (MAXZIDE-25) 37.5-25 MG tablet Take 1 tablet by mouth daily.    Yes [provider]  Vitamin D, Ergocalciferol, (DRISDOL) 50000 units CAPS capsule Take 50,000 Units by mouth once a week. Sundays 11/03/15  Yes [provider]  VYTORIN 10-40 MG tablet Take 0.5 tablets by mouth daily.  09/12/15  Yes [provider]     Positive ROS: Otherwise negative  All  other systems have been reviewed and were otherwise negative with the exception of those mentioned in the HPI and as above.  Physical Exam: Constitutional: Alert, well-appearing, no acute distress Ears: External ears without lesions or tenderness. Ear canals had mild amount of wax buildup in both ear canals down deep within the ear canal distal to the hearing aids.  This was cleaned with forceps and curettes.  TMs were clear bilaterally.. Nasal: External nose without lesions. Clear nasal passages Oral: Oropharynx clear. Neck: No palpable adenopathy or masses Respiratory: Breathing comfortably  Skin: No facial/neck lesions or rash noted.  Cerumen impaction removal  Date/Time: 02/08/2020 7:02 PM Performed by: Rozetta Nunnery,  MD Authorized by: Rozetta Nunnery, MD   Consent:    Consent obtained:  Verbal   Consent given by:  Patient   Risks discussed:  Pain and bleeding Procedure details:    Location:  L ear and R ear   Procedure type: curette, suction and forceps   Post-procedure details:    Inspection:  TM intact and canal normal   Hearing quality:  Improved   Patient tolerance of procedure:  Tolerated well, no immediate complications Comments:     TMs are clear bilaterally.    Assessment: Bilateral cerumen buildup in patient with hearing aids.  Plan: This was cleaned in the office he will follow-up as needed.  Radene Journey, MD

## 2020-03-12 ENCOUNTER — Other Ambulatory Visit: Payer: Self-pay | Admitting: Cardiology

## 2020-04-02 ENCOUNTER — Other Ambulatory Visit: Payer: Self-pay | Admitting: Cardiology

## 2020-05-03 ENCOUNTER — Encounter: Payer: Self-pay | Admitting: Cardiology

## 2020-05-03 ENCOUNTER — Other Ambulatory Visit: Payer: Self-pay

## 2020-05-03 ENCOUNTER — Ambulatory Visit: Payer: Medicare Other | Admitting: Cardiology

## 2020-05-03 VITALS — BP 145/80 | HR 68 | Ht 68.0 in | Wt 182.0 lb

## 2020-05-03 DIAGNOSIS — I351 Nonrheumatic aortic (valve) insufficiency: Secondary | ICD-10-CM

## 2020-05-03 DIAGNOSIS — E119 Type 2 diabetes mellitus without complications: Secondary | ICD-10-CM

## 2020-05-03 DIAGNOSIS — Z87898 Personal history of other specified conditions: Secondary | ICD-10-CM

## 2020-05-03 DIAGNOSIS — Z87891 Personal history of nicotine dependence: Secondary | ICD-10-CM

## 2020-05-03 DIAGNOSIS — I10 Essential (primary) hypertension: Secondary | ICD-10-CM

## 2020-05-03 NOTE — Progress Notes (Signed)
[name] Date of Birth: Dec 08, 1930 MRN: [id number] Primary Care Provider:Paterson, Reuel Boomaniel, MD Former Cardiology Providers: Altamese CarolinaAshton Kelley, APRN, FNP-C Primary Cardiologist: Tessa LernerSunit Yunus Stoklosa, DO, [facility] (established care 05/03/2020)  Date: 05/03/20 Last Visit: 10/30/2019  Chief Complaint  Patient presents with  . Follow-up    valve disease    HPI  [name] is a 86 y.o.  male who presents to the office with a chief complaint of " follow-up on valve disease." Patient's past medical history and cardiovascular risk factors include: Diet-controlled type II diabetes mellitus, hyperlipidemia, hypertension, moderate aortic regurgitation, previous episodes of syncope felt to be secondary to medication, former smoker, advanced age.  Patient is followed for his underlying moderate aortic regurgitation and history of syncope.  He is here for reevaluation.  I am seeing him for the first time for the above-mentioned chief complaint.  Patient is noted to have moderate aortic regurgitation on the echocardiogram from 2020.  His LVEF is preserved.  He is not in congestive heart failure and overall euvolemic.  No hospitalizations or urgent care visits in the last office visit.  He denies any chest pain at rest or with effort related activities his overall functional status remains stable.  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  Patient had a history of syncopal event which was contributed to beta-blocker use.  At that time his beta-blocker therapy was reduced.  Half the dose and no recurrence of symptoms.  FUNCTIONAL STATUS: He is active for age (manages 2 acer and dose yardwork).    ALLERGIES: No Known Allergies   MEDICATION LIST PRIOR TO VISIT: Current Outpatient Medications on File Prior to Visit  Medication Sig Dispense Refill  . acetaminophen (TYLENOL) 500 MG tablet Take 500 mg by mouth daily as needed.    Marland Kitchen. amLODipine (NORVASC) 5  MG tablet TAKE 1 TABLET DAILY 90 tablet 1  . aspirin EC 81 MG tablet Take 81 mg by mouth daily.    Marland Kitchen. losartan (COZAAR) 50 MG tablet TAKE 1 TABLET DAILY 90 tablet 1  . metoprolol succinate (TOPROL-XL) 25 MG 24 hr tablet TAKE 1 TABLET DAILY (Patient taking differently: Take 25 mg by mouth daily. ) 90 tablet 4  . nitroGLYCERIN (NITROSTAT) 0.4 MG SL tablet PLACE 1 TABLET UNDER THE TONGUE AS NEEDED FOR CHEST PAIN  1  . triamterene-hydrochlorothiazide (MAXZIDE-25) 37.5-25 MG tablet Take 1 tablet by mouth daily.     Marland Kitchen. VYTORIN 10-40 MG tablet Take 0.5 tablets by mouth daily.     . Vitamin D, Ergocalciferol, (DRISDOL) 50000 units CAPS capsule Take 50,000 Units by mouth once a week. Sundays  0   No current facility-administered medications on file prior to visit.    PAST MEDICAL HISTORY: Past Medical History:  Diagnosis Date  . HOH (hard of hearing)   . Hypertension     PAST SURGICAL HISTORY: Past Surgical History:  Procedure Laterality Date  . ESOPHAGOGASTRODUODENOSCOPY N/A 11/30/2015   Procedure: ESOPHAGOGASTRODUODENOSCOPY (EGD);  Surgeon: Dorena CookeyJohn Hayes, MD;  [facility];  Service: Endoscopy;  Laterality: N/A;  . NO PAST SURGERIES      FAMILY HISTORY: The patient's family history is not on file.   SOCIAL HISTORY:  The patient  reports that he quit smoking about 26 years ago. His smoking use included cigarettes and pipe. He has a 12.50 pack-year smoking history. He quit smokeless tobacco use about 24 years ago. He reports that he does not  drink alcohol and does not use drugs.  Review of Systems  Constitutional: Negative for chills and fever.  HENT: Negative for hoarse voice and nosebleeds.   Eyes: Negative for discharge, double vision and pain.  Cardiovascular: Negative for chest pain, claudication, dyspnea on exertion, leg swelling, near-syncope, orthopnea, palpitations, paroxysmal nocturnal dyspnea and syncope.  Respiratory: Negative for hemoptysis and shortness of breath.     Musculoskeletal: Negative for muscle cramps and myalgias.  Gastrointestinal: Negative for abdominal pain, constipation, diarrhea, hematemesis, hematochezia, melena, nausea and vomiting.  Neurological: Negative for dizziness and light-headedness.    PHYSICAL EXAM: Vitals with BMI 05/03/2020 10/30/2019 07/31/2019  Height 5\' 8"  5\' 8"  5\' 8"   Weight 182 lbs 184 lbs 181 lbs  BMI 27.68 27.98 27.53  Systolic 145 132  Diastolic 80 67 61  Pulse 68 60 65    CONSTITUTIONAL: Well-developed and well-nourished. No acute distress.  SKIN: Skin is warm and dry. No rash noted. No cyanosis. No pallor. No jaundice HEAD: Normocephalic and atraumatic.  EYES: No scleral icterus MOUTH/THROAT: Moist oral membranes.  NECK: No JVD present. No thyromegaly noted. No carotid bruits  LYMPHATIC: No visible cervical adenopathy.  CHEST Normal respiratory effort. No intercostal retractions  LUNGS: Clear to auscultation bilaterally.  No stridor. No wheezes. No rales.  CARDIOVASCULAR: Regular rate and rhythm, positive S1-S2, soft diastolic murmur heard at the second right intercostal space, no gallops or rubs. ABDOMINAL: No apparent ascites.  EXTREMITIES: No peripheral edema  HEMATOLOGIC: No significant bruising NEUROLOGIC: Oriented to person, place, and time. Nonfocal. Normal muscle tone.  PSYCHIATRIC: Normal mood and affect. Normal behavior. 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 IRBBB. Non specific T wave abnormality. No change compared to EKG 03/11/2019  05/03/2020: Normal sinus rhythm, 69 bpm, RBBB, consider old high lateral wall infarct, without underlying ischemia or injury pattern.  Since prior EKG first-degree AV block is resolved and IRBBB is now RBBB.  Echocardiogram: 03/03/2019: Normal LV systolic function with EF 55%. Left ventricle cavity is normal in size. Mild concentric hypertrophy of the left  ventricle. Normal global wall motion. Doppler evidence of grade I (impaired) diastolic dysfunction, normal LAP.  Trileaflet aortic valve. Mild calcification of the aortic valve annulus. Moderate (Grade II), eccentric, posteriorly directed aortic regurgitation. Mild tricuspid regurgitation. Estimated pulmonary artery systolic pressure is 24 mmHg. No significant change compared to previous study in 2018.   Stress Testing:  03/19/2014 : 1. Resting EKG SR with borderline first-degree AV block, left atrial enlargement, incomplete right bundle branch block. Stress EKG was non diagnostic for ischemia. No ST-T changes of ischemia noted with pharmacologic stress testing. Stress terminated due to completion of protocol. 2. The perfusion study demonstrated mild chest wall attenuation artifact in the anterior wall. There was small to moderate-sized moderate ischemia involving the basal inferior and mid inferior and inferolateral wall. Dynamic gated images reveal mild inferior wall hypokinesis. Left ventricular ejection fraction was estimated to be 63%. This represents an intermediate risk study.  Heart Catheterization: None  Carotid duplex: 06/24/2017: No hemodynamically significant arterial disease in the internal carotid artery bilaterally. Mild heteregenous plaque. Antegrade right vertebral artery flow. Antegrade left vertebral artery flow.  30 day event monitor 10/27-11/26/20: Normal sinus rhythm. 1 auto detected event occurred on day 18 at 0627 for NSR with first degree AV block. No patient triggered events occurred. Min HR was 49 bpm on day 19 at 0626. No A fib  or high degree AV block was noted  LABORATORY DATA:  HDL 47 MG/DL 4/0/9811 LDL 91.478 mg 05/01/2019 Cholesterol, total 142.000 m 05/01/2019 Triglycerides 85.000 05/01/2019 A1C 6.800 % 05/01/2019 Glucose Random N 05/08/2019 MicroAlbumin Urine 6.000 05/08/2019 MicroAlbumin/Creat 7.6 MG/DL 2/95/6213 BUN 08.657 mg 05/01/2019 Creatinine, Serum 0.900  mg/ 05/01/2019  IMPRESSION:    ICD-10-CM   1. Moderate aortic regurgitation  I35.1 EKG 12-Lead    EKG 12-Lead    PCV ECHOCARDIOGRAM COMPLETE  2. Essential hypertension  I10 EKG 12-Lead    EKG 12-Lead  3. Controlled type 2 diabetes mellitus without complication, without long-term current use of insulin (HCC)  E11.9   4. History of syncope  Z87.898   5. Former smoker  Z87.891      RECOMMENDATIONS: VANCE HOCHMUTH is a 86 y.o. male whose past medical history and cardiovascular risk factors include: Diet-controlled type II diabetes mellitus, hyperlipidemia, hypertension, moderate aortic regurgitation, previous episodes of syncope felt to be secondary to medication, former smoker, advanced age.  Aortic regurgitation: Last evaluated by echocardiogram in 2020. Clinically patient remained stable without any symptoms of chest pain or anginal equivalent or congestive heart failure. Patient continues to have good functional capacity for age without any significant decline in endurance. Recommend reevaluation of his aortic regurgitation prior to next office visit in 1 year.  History of syncope: Patient had an episode of syncope in the past which was attributed to medication induced event.  Since decreasing his beta-blockers he has not had a syncopal event.  Continue to monitor.  Essential hypertension: Patient's blood pressure is within acceptable range. Currently managed by primary team. Medications reconciled.  Former smoker: Educated on the importance of continued smoking cessation.  Patient states that he has his yearly physical with his primary care provider Dr. Jarold Motto next week.  Patient is encouraged to follow-up with him for his other chronic comorbid conditions.  Total time spent: 30 minutes.  I was seeing the patient for the first time reviewed prior office notes, laboratory results, echocardiogram, stress test, EKG, discussing disease management and coordination of care.  FINAL  MEDICATION LIST END OF ENCOUNTER: No orders of the defined types were placed in this encounter.   There are no discontinued medications.   Current Outpatient Medications:  .  acetaminophen (TYLENOL) 500 MG tablet, Take 500 mg by mouth daily as needed., Disp: , Rfl:  .  amLODipine (NORVASC) 5 MG tablet, TAKE 1 TABLET DAILY, Disp: 90 tablet, Rfl: 1 .  aspirin EC 81 MG tablet, Take 81 mg by mouth daily., Disp: , Rfl:  .  losartan (COZAAR) 50 MG tablet, TAKE 1 TABLET DAILY, Disp: 90 tablet, Rfl: 1 .  metoprolol succinate (TOPROL-XL) 25 MG 24 hr tablet, TAKE 1 TABLET DAILY (Patient taking differently: Take 25 mg by mouth daily. ), Disp: 90 tablet, Rfl: 4 .  nitroGLYCERIN (NITROSTAT) 0.4 MG SL tablet, PLACE 1 TABLET UNDER THE TONGUE AS NEEDED FOR CHEST PAIN, Disp: , Rfl: 1 .  triamterene-hydrochlorothiazide (MAXZIDE-25) 37.5-25 MG tablet, Take 1 tablet by mouth daily. , Disp: , Rfl:  .  VYTORIN 10-40 MG tablet, Take 0.5 tablets by mouth daily. , Disp: , Rfl:  .  Vitamin D, Ergocalciferol, (DRISDOL) 50000 units CAPS capsule, Take 50,000 Units by mouth once a week. Sundays, Disp: , Rfl: 0  Orders Placed This Encounter  Procedures  . EKG 12-Lead  . EKG 12-Lead  . PCV ECHOCARDIOGRAM COMPLETE   --Continue cardiac medications as reconciled in final medication list. --Return in  about 1 year (around 05/03/2021) for Review test results, Reevaluation of AR. Or sooner if needed. --Continue follow-up with your primary care physician regarding the management of your other chronic comorbid conditions.  Patient's questions and concerns were addressed to his satisfaction. He voices understanding of the instructions provided during this encounter.   This note was created using a voice recognition software as a result there may be grammatical errors inadvertently enclosed that do not reflect the nature of this encounter. Every attempt is made to correct such errors.  Tessa Lerner, [place], [facility]  Pager:  [phone] Office: [phone]

## 2020-06-13 ENCOUNTER — Other Ambulatory Visit: Payer: Self-pay

## 2020-06-13 MED ORDER — LOSARTAN POTASSIUM 50 MG PO TABS
50.0000 mg | ORAL_TABLET | Freq: Every day | ORAL | 1 refills | Status: DC
Start: 2020-06-13 — End: 2020-06-13

## 2020-06-13 MED ORDER — LOSARTAN POTASSIUM 50 MG PO TABS
50.0000 mg | ORAL_TABLET | Freq: Every day | ORAL | 3 refills | Status: DC
Start: 2020-06-13 — End: 2021-09-11

## 2020-10-08 ENCOUNTER — Other Ambulatory Visit: Payer: Self-pay | Admitting: Cardiology

## 2021-03-09 ENCOUNTER — Other Ambulatory Visit: Payer: Self-pay | Admitting: Cardiology

## 2021-04-17 ENCOUNTER — Other Ambulatory Visit: Payer: Medicare Other

## 2021-04-18 ENCOUNTER — Other Ambulatory Visit: Payer: Self-pay

## 2021-04-18 ENCOUNTER — Ambulatory Visit: Payer: Medicare Other

## 2021-04-18 DIAGNOSIS — I351 Nonrheumatic aortic (valve) insufficiency: Secondary | ICD-10-CM

## 2021-04-20 ENCOUNTER — Encounter: Payer: Self-pay | Admitting: Cardiology

## 2021-04-20 ENCOUNTER — Ambulatory Visit: Payer: Medicare Other | Admitting: Cardiology

## 2021-04-20 ENCOUNTER — Other Ambulatory Visit: Payer: Self-pay

## 2021-04-20 VITALS — BP 137/67 | HR 68 | Resp 16 | Ht 68.0 in | Wt 185.0 lb

## 2021-04-20 DIAGNOSIS — Z87891 Personal history of nicotine dependence: Secondary | ICD-10-CM

## 2021-04-20 DIAGNOSIS — E119 Type 2 diabetes mellitus without complications: Secondary | ICD-10-CM

## 2021-04-20 DIAGNOSIS — I10 Essential (primary) hypertension: Secondary | ICD-10-CM

## 2021-04-20 DIAGNOSIS — I351 Nonrheumatic aortic (valve) insufficiency: Secondary | ICD-10-CM

## 2021-04-20 DIAGNOSIS — Z87898 Personal history of other specified conditions: Secondary | ICD-10-CM

## 2021-04-20 NOTE — Progress Notes (Signed)
[name] Date of Birth: April 09, 1931 MRN: [id number] Primary Care Provider:Paterson, Reuel Boom, MD Former Cardiology Providers: Altamese , APRN, FNP-C Primary Cardiologist: Tessa Lerner, DO, [facility] (established care 05/03/2020)  Date: 04/20/21 Last Office Visit: 05/03/2020  Chief Complaint  Patient presents with   Moderate aortic regurgitation   Results   Follow-up    HPI  [name] is a 87 y.o.  male who presents to the office with a chief complaint of " 1 year follow-up for reevaluation of aortic regurgitation." Patient's past medical history and cardiovascular risk factors include: Diet-controlled type II diabetes mellitus, hyperlipidemia, hypertension, moderate to severe aortic regurgitation, history of syncope, former smoker, advanced age.  Patient presents for his yearly follow-up for reevaluation of moderate aortic regurgitation.  He had an echocardiogram earlier this week images reviewed as a part of this office visit final report is pending.  The severity of aortic regurgitation appears to be moderate to severe.  However, clinically patient states that he is completely asymptomatic.  He denies any chest pain or shortness of breath at rest or with effort related activities.  Since last office visit he denies any hospitalizations or urgent care visits for cardiovascular symptoms.  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  FUNCTIONAL STATUS: He is active for age (manages 2 acer and dose yardwork).    ALLERGIES: No Known Allergies   MEDICATION LIST PRIOR TO VISIT: Current Outpatient Medications on File Prior to Visit  Medication Sig Dispense Refill   acetaminophen (TYLENOL) 500 MG tablet Take 500 mg by mouth daily as needed.     amLODipine (NORVASC) 5 MG tablet TAKE 1 TABLET DAILY 90 tablet 1   aspirin EC 81 MG tablet Take 81 mg by mouth daily.     losartan (COZAAR) 50 MG tablet Take 1 tablet (50  mg total) by mouth daily. 90 tablet 3   metFORMIN (GLUCOPHAGE-XR) 500 MG 24 hr tablet Take 500 mg by mouth daily.     metoprolol succinate (TOPROL-XL) 50 MG 24 hr tablet Take 50 mg by mouth daily. Take with or immediately following a meal.     nitroGLYCERIN (NITROSTAT) 0.4 MG SL tablet PLACE 1 TABLET UNDER THE TONGUE AS NEEDED FOR CHEST PAIN  1   triamterene-hydrochlorothiazide (MAXZIDE-25) 37.5-25 MG tablet Take 1 tablet by mouth daily.      VYTORIN 10-40 MG tablet Take 0.5 tablets by mouth daily.      No current facility-administered medications on file prior to visit.    PAST MEDICAL HISTORY: Past Medical History:  Diagnosis Date   Aortic regurgitation    HOH (hard of hearing)    Hypertension     PAST SURGICAL HISTORY: Past Surgical History:  Procedure Laterality Date   ESOPHAGOGASTRODUODENOSCOPY N/A 11/30/2015   Procedure: ESOPHAGOGASTRODUODENOSCOPY (EGD);  Surgeon: Dorena Cookey, MD;  Location[facility];  Service: Endoscopy;  Laterality: N/A;   NO PAST SURGERIES      FAMILY HISTORY: The patient's Family history is unknown by patient.   SOCIAL HISTORY:  The patient  reports that he quit smoking about 27 years ago. His smoking use included cigarettes and pipe. He has a 12.50 pack-year smoking history. He quit smokeless tobacco use about 25 years ago. He reports that he does not drink alcohol and does not use drugs.  Review of Systems  Constitutional: Negative for chills and fever.  HENT:  Negative for hoarse voice and  nosebleeds.        Hard of hearing.  Eyes:  Negative for discharge, double vision and pain.  Cardiovascular:  Negative for chest pain, claudication, dyspnea on exertion, leg swelling, near-syncope, orthopnea, palpitations, paroxysmal nocturnal dyspnea and syncope.  Respiratory:  Negative for hemoptysis and shortness of breath.   Musculoskeletal:  Negative for muscle cramps and myalgias.  Gastrointestinal:  Negative for abdominal pain, constipation, diarrhea,  hematemesis, hematochezia, melena, nausea and vomiting.  Neurological:  Negative for dizziness and light-headedness.   PHYSICAL EXAM: Vitals with BMI 04/20/2021 05/03/2020 10/30/2019  Height 5\' 8"  5\' 8"  5\' 8"   Weight 185 lbs 182 lbs 184 lbs  BMI 28.14 27.68 27.98  Systolic 137 145 161132  Diastolic 67 80 67  Pulse 68 68 60    CONSTITUTIONAL: Well-developed and well-nourished. No acute distress.  SKIN: Skin is warm and dry. No rash noted. No cyanosis. No pallor. No jaundice HEAD: Normocephalic and atraumatic.  EYES: No scleral icterus MOUTH/THROAT: Moist oral membranes.  NECK: No JVD present. No thyromegaly noted. No carotid bruits  LYMPHATIC: No visible cervical adenopathy.  CHEST Normal respiratory effort. No intercostal retractions  LUNGS: Clear to auscultation bilaterally.  No stridor. No wheezes. No rales.  CARDIOVASCULAR: Regular rate and rhythm, positive S1-S2, soft diastolic murmur heard at the second right intercostal space, no gallops or rubs. ABDOMINAL: No apparent ascites.  EXTREMITIES: No peripheral edema  HEMATOLOGIC: No significant bruising NEUROLOGIC: Oriented to person, place, and time. Nonfocal. Normal muscle tone.  PSYCHIATRIC: Normal mood and affect. Normal behavior. Cooperative  CARDIAC DATABASE: EKG: 04/20/2021: Normal sinus rhythm, 65 bpm, right bundle branch block, without underlying ischemia or injury pattern.   Echocardiogram: 03/03/2019: Normal LV systolic function with EF 55%. Left ventricle cavity is normal in size. Mild concentric hypertrophy of the left ventricle. Normal global wall motion. Doppler evidence of grade I (impaired) diastolic dysfunction, normal LAP.  Trileaflet aortic valve. Mild calcification of the aortic valve annulus. Moderate (Grade II), eccentric, posteriorly directed aortic regurgitation. Mild tricuspid regurgitation. Estimated pulmonary artery systolic pressure is 24 mmHg. No significant change compared to previous study in 2018.    04/18/2021: Echocardiogram images reviewed.  Final report forthcoming.  Stress Testing:  03/19/2014 : 1. Resting EKG SR with borderline first-degree AV block, left atrial enlargement, incomplete right bundle branch block. Stress EKG was non diagnostic for ischemia. No ST-T changes of ischemia noted with pharmacologic stress testing. Stress terminated due to completion of protocol. 2. The perfusion study demonstrated mild chest wall attenuation artifact in the anterior wall. There was small to moderate-sized moderate ischemia involving the basal inferior and mid inferior and inferolateral wall. Dynamic gated images reveal mild inferior wall hypokinesis. Left ventricular ejection fraction was estimated to be 63%. This represents an intermediate risk study.  Heart Catheterization: None  Carotid duplex: 06/24/2017: No hemodynamically significant arterial disease in the internal carotid artery bilaterally. Mild heteregenous plaque. Antegrade right vertebral artery flow. Antegrade left vertebral artery flow.  30 day event monitor 10/27-11/26/20: Normal sinus rhythm. 1 auto detected event occurred on day 18 at 0627 for NSR with first degree AV block. No patient triggered events occurred. Min HR was 49 bpm on day 19 at 0626. No A fib or high degree AV block was noted  LABORATORY DATA:  HDL 47 MG/DL 0/9/60459/11/2018 LDL 40.98178.000 mg 05/01/2019 Cholesterol, total 142.000 m 05/01/2019 Triglycerides 85.000 05/01/2019 A1C 6.800 % 05/01/2019 Glucose Random N 05/08/2019 MicroAlbumin Urine 6.000 05/08/2019 MicroAlbumin/Creat 7.6 MG/DL 1/91/47829/06/2019 BUN 95.62113.000 mg 05/01/2019 Creatinine, Serum  0.900 mg/ 05/01/2019  IMPRESSION:    ICD-10-CM   1. Moderate to severe aortic valve regurgitation  I35.1 EKG 12-Lead    ECHOCARDIOGRAM COMPLETE    2. Essential hypertension  I10     3. Controlled type 2 diabetes mellitus without complication, without long-term current use of insulin (HCC)  E11.9     4. Former smoker  Z87.891         RECOMMENDATIONS: JAXSTON CHOHAN is a 87 y.o. male whose past medical history and cardiovascular risk factors include: Diet-controlled type II diabetes mellitus, hyperlipidemia, hypertension, moderate aortic regurgitation, previous episodes of syncope felt to be secondary to medication, former smoker, advanced age.  Aortic regurgitation: Most recent echocardiogram reviewed with the patient at today's office visit.  Final report pending The severity of aortic regurgitation appears to be moderate to severe. We discussed considering transesophageal echocardiogram for further evaluation.  However, patient states that he would like to continue conservative management as he is the primary caretaker of his disabled son. Shared decision was to plan an echocardiogram in 6 months to reevaluate disease progression. Discussed what symptoms you should expect given the progression of his aortic regurgitation and if increases in intensity frequency or duration should seek medical attention.  Essential hypertension: Office blood pressures are very well controlled.  Medications reconciled.  Currently managed by primary care provider.  Former smoker: Educated on the importance of continued smoking cessation.  Total time spent: 32 minutes.   FINAL MEDICATION LIST END OF ENCOUNTER: No orders of the defined types were placed in this encounter.   Medications Discontinued During This Encounter  Medication Reason   metoprolol succinate (TOPROL-XL) 25 MG 24 hr tablet Error   Vitamin D, Ergocalciferol, (DRISDOL) 50000 units CAPS capsule Error     Current Outpatient Medications:    acetaminophen (TYLENOL) 500 MG tablet, Take 500 mg by mouth daily as needed., Disp: , Rfl:    amLODipine (NORVASC) 5 MG tablet, TAKE 1 TABLET DAILY, Disp: 90 tablet, Rfl: 1   aspirin EC 81 MG tablet, Take 81 mg by mouth daily., Disp: , Rfl:    losartan (COZAAR) 50 MG tablet, Take 1 tablet (50 mg total) by mouth daily., Disp: 90  tablet, Rfl: 3   metFORMIN (GLUCOPHAGE-XR) 500 MG 24 hr tablet, Take 500 mg by mouth daily., Disp: , Rfl:    metoprolol succinate (TOPROL-XL) 50 MG 24 hr tablet, Take 50 mg by mouth daily. Take with or immediately following a meal., Disp: , Rfl:    nitroGLYCERIN (NITROSTAT) 0.4 MG SL tablet, PLACE 1 TABLET UNDER THE TONGUE AS NEEDED FOR CHEST PAIN, Disp: , Rfl: 1   triamterene-hydrochlorothiazide (MAXZIDE-25) 37.5-25 MG tablet, Take 1 tablet by mouth daily. , Disp: , Rfl:    VYTORIN 10-40 MG tablet, Take 0.5 tablets by mouth daily. , Disp: , Rfl:   Orders Placed This Encounter  Procedures   EKG 12-Lead   ECHOCARDIOGRAM COMPLETE   --Continue cardiac medications as reconciled in final medication list. --Return in about 6 months (around 10/25/2021) for Follow up aortic regurgitation, review results.. Or sooner if needed. --Continue follow-up with your primary care physician regarding the management of your other chronic comorbid conditions.  Patient's questions and concerns were addressed to his satisfaction. He voices understanding of the instructions provided during this encounter.   This note was created using a voice recognition software as a result there may be grammatical errors inadvertently enclosed that do not reflect the nature of this encounter. Every attempt is  made to correct such errors.  Tessa Lerner, [place], [facility]  Pager: [phone] Office: [phone]

## 2021-05-03 ENCOUNTER — Ambulatory Visit: Payer: Medicare Other | Admitting: Cardiology

## 2021-06-12 ENCOUNTER — Other Ambulatory Visit: Payer: Self-pay

## 2021-06-12 ENCOUNTER — Ambulatory Visit (HOSPITAL_COMMUNITY)
Admission: RE | Admit: 2021-06-12 | Discharge: 2021-06-12 | Disposition: A | Discharge status: Home / Self Care | Payer: Medicare Other | Source: Ambulatory Visit | Attending: Cardiology | Admitting: Cardiology

## 2021-06-12 DIAGNOSIS — I351 Nonrheumatic aortic (valve) insufficiency: Secondary | ICD-10-CM | POA: Insufficient documentation

## 2021-06-12 NOTE — Progress Notes (Signed)
  Echocardiogram 2D Echocardiogram has been performed.  Janalyn Harder 06/12/2021, 2:08 PM

## 2021-06-24 LAB — ECHOCARDIOGRAM COMPLETE
AR max vel: 3.15 cm2
AV Area VTI: 3.16 cm2
AV Area mean vel: 3.29 cm2
AV Mean grad: 3 mmHg
AV Peak grad: 6 mmHg
Ao pk vel: 1.22 m/s
Area-P 1/2: 2.69 cm2
Calc EF: 66.9 %
P 1/2 time: 502 msec
S' Lateral: 3.25 cm
Single Plane A2C EF: 70.3 %
Single Plane A4C EF: 62.5 %

## 2021-06-25 DIAGNOSIS — I351 Nonrheumatic aortic (valve) insufficiency: Secondary | ICD-10-CM | POA: Insufficient documentation

## 2021-06-27 NOTE — Progress Notes (Signed)
Called pt and made an appt for 11/21 to go over results

## 2021-07-12 NOTE — Progress Notes (Signed)
Called patient, [name], [name]

## 2021-07-13 NOTE — Progress Notes (Signed)
Patient is aware and will be here for his scheduled appointment on NOV 21st. @ 2pm/

## 2021-07-17 ENCOUNTER — Ambulatory Visit: Payer: Medicare Other | Admitting: Cardiology

## 2021-07-17 ENCOUNTER — Encounter: Payer: Self-pay | Admitting: Cardiology

## 2021-07-17 ENCOUNTER — Other Ambulatory Visit: Payer: Self-pay

## 2021-07-17 VITALS — BP 138/75 | HR 72 | Temp 97.9°F | Resp 17 | Ht 68.0 in | Wt 185.0 lb

## 2021-07-17 DIAGNOSIS — Z87891 Personal history of nicotine dependence: Secondary | ICD-10-CM

## 2021-07-17 DIAGNOSIS — I351 Nonrheumatic aortic (valve) insufficiency: Secondary | ICD-10-CM

## 2021-07-17 DIAGNOSIS — E119 Type 2 diabetes mellitus without complications: Secondary | ICD-10-CM

## 2021-07-17 DIAGNOSIS — I10 Essential (primary) hypertension: Secondary | ICD-10-CM

## 2021-07-17 NOTE — Progress Notes (Signed)
[name] Date of Birth: 12-Dec-1930 MRN: [id number] Primary Care Provider:Paterson, Reuel Boom, MD Former Cardiology Providers: Altamese [place], APRN, FNP-C Primary Cardiologist: Tessa Lerner, DO, [facility] (established care 05/03/2020)  Date: 07/17/21 Last Office Visit: 04/20/2021  Chief Complaint  Patient presents with   Results    HPI  [name] is a 87 y.o.  male who presents to the office with a chief complaint of " reviewed the results of the echocardiogram.." Patient's past medical history and cardiovascular risk factors include: Diet-controlled type II diabetes mellitus, hyperlipidemia, hypertension, moderate aortic regurgitation, history of syncope, former smoker, advanced age.  Patient was recently seen in August 2022 as part of his yearly follow-up visit for reevaluation of aortic regurgitation.  At the last office visit the shared decision was to repeat an echocardiogram in 6 months to reevaluate the severity of AR as there was concern for progression of disease.  Unfortunately, patient had a repeat echocardiogram couple months later in October 2022.  Based on the transthoracic echo the severity of aortic regurgitation appears to be at least moderate results including the images reviewed with the patient at today's visit and noted below for further reference.  In addition, he was noted to have an incidental echodensity on the tricuspid valve as noted below and after reviewing the images with the patient the differential diagnoses includes but not limited to healed vegetation, calcium deposits, or valve pathology.  He denies any chest pain at rest or with effort related activities.  He is overall euvolemic and not in congestive heart failure.  No change in overall physical endurance.  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  FUNCTIONAL STATUS: He is active for age (manages 2 acer and dose yardwork).    ALLERGIES: No Known Allergies   MEDICATION LIST PRIOR TO  VISIT: Current Outpatient Medications on File Prior to Visit  Medication Sig Dispense Refill   acetaminophen (TYLENOL) 500 MG tablet Take 500 mg by mouth daily as needed.     amLODipine (NORVASC) 5 MG tablet TAKE 1 TABLET DAILY 90 tablet 1   aspirin EC 81 MG tablet Take 81 mg by mouth daily.     losartan (COZAAR) 50 MG tablet Take 1 tablet (50 mg total) by mouth daily. 90 tablet 3   metoprolol succinate (TOPROL-XL) 50 MG 24 hr tablet Take 50 mg by mouth daily. Take with or immediately following a meal.     nitroGLYCERIN (NITROSTAT) 0.4 MG SL tablet PLACE 1 TABLET UNDER THE TONGUE AS NEEDED FOR CHEST PAIN  1   triamterene-hydrochlorothiazide (MAXZIDE-25) 37.5-25 MG tablet Take 1 tablet by mouth daily.      Vitamin D, Ergocalciferol, (DRISDOL) 1.25 MG (50000 UNIT) CAPS capsule Take 50,000 Units by mouth every 7 (seven) days.     VYTORIN 10-40 MG tablet Take 0.5 tablets by mouth daily.      No current facility-administered medications on file prior to visit.    PAST MEDICAL HISTORY: Past Medical History:  Diagnosis Date   Aortic regurgitation    HOH (hard of hearing)    Hypertension     PAST SURGICAL HISTORY: Past Surgical History:  Procedure Laterality Date   ESOPHAGOGASTRODUODENOSCOPY N/A 11/30/2015   Procedure: ESOPHAGOGASTRODUODENOSCOPY (EGD);  Surgeon: Dorena Cookey, MD;  Location: [facility];  Service: Endoscopy;  Laterality: N/A;   NO PAST SURGERIES      FAMILY HISTORY: The patient's Family history is unknown by patient.   SOCIAL HISTORY:  The patient  reports that  he quit smoking about 27 years ago. His smoking use included cigarettes and pipe. He has a 12.50 pack-year smoking history. He quit smokeless tobacco use about 25 years ago. He reports that he does not drink alcohol and does not use drugs.  Review of Systems  Constitutional: Negative for chills and fever.  HENT:  Negative for hoarse voice and nosebleeds.        Hard of hearing.  Eyes:  Negative for discharge,  double vision and pain.  Cardiovascular:  Negative for chest pain, claudication, dyspnea on exertion, leg swelling, near-syncope, orthopnea, palpitations, paroxysmal nocturnal dyspnea and syncope.  Respiratory:  Negative for hemoptysis and shortness of breath.   Musculoskeletal:  Negative for muscle cramps and myalgias.  Gastrointestinal:  Negative for abdominal pain, constipation, diarrhea, hematemesis, hematochezia, melena, nausea and vomiting.  Neurological:  Negative for dizziness and light-headedness.   PHYSICAL EXAM: Vitals with BMI 07/17/2021 04/20/2021 05/03/2020  Height 5\' 8"  5\' 8"  5\' 8"   Weight 185 lbs 185 lbs 182 lbs  BMI 28.14 28.14 27.68  Systolic 138 137 737  Diastolic 75 67 80  Pulse 72 68 68    CONSTITUTIONAL: Well-developed and well-nourished. No acute distress.  SKIN: Skin is warm and dry. No rash noted. No cyanosis. No pallor. No jaundice HEAD: Normocephalic and atraumatic.  EYES: No scleral icterus MOUTH/THROAT: Moist oral membranes.  NECK: No JVD present. No thyromegaly noted. No carotid bruits  LYMPHATIC: No visible cervical adenopathy.  CHEST Normal respiratory effort. No intercostal retractions  LUNGS: Clear to auscultation bilaterally.  No stridor. No wheezes. No rales.  CARDIOVASCULAR: Regular rate and rhythm, positive S1-S2, soft diastolic murmur heard at the second right intercostal space, no gallops or rubs. ABDOMINAL: No apparent ascites.  EXTREMITIES: No peripheral edema  HEMATOLOGIC: No significant bruising NEUROLOGIC: Oriented to person, place, and time. Nonfocal. Normal muscle tone.  PSYCHIATRIC: Normal mood and affect. Normal behavior. Cooperative  CARDIAC DATABASE: EKG: 04/20/2021: Normal sinus rhythm, 65 bpm, right bundle branch block, without underlying ischemia or injury pattern.   Echocardiogram: 03/03/2019: LVEF 55%, grade 1 diastolic impairment, moderate/eccentric AR.    04/18/2021: LVEF 55%, grade 1 diastolic impairment, moderate AR, mild  to moderate TR, PASP 26 mmHg.  06/12/2021: 1. Left ventricular ejection fraction, by estimation, is 60 to 65%. The left ventricle has normal function. The left ventricle has no regional wall motion abnormalities. There is mild left ventricular hypertrophy. Left ventricular diastolic parameters  were normal. The average left ventricular global longitudinal strain is -26.0 %. The global longitudinal strain is normal. 2. Right ventricular systolic function is normal. The right ventricular size is normal. There is normal pulmonary artery systolic pressure. 3. The mitral valve is degenerative. Trivial mitral valve regurgitation. No evidence of mitral stenosis. 4. Echo density noted on the tricuspid valve measuring 1.8x0.67cm (differtential includes but not limited to healed vegetation, calcium deposit, prolapse / flail leaflet, etc). 5. The aortic valve is tricuspid. Aortic valve regurgitation atleast moderate, eccentric jet along the anterior mitral valve leaflet, holodiastolic flow within descending aorta is not seen. Mild aortic valve sclerosis is present, with no evidence of  aortic valve stenosis. 6. The inferior vena cava is normal in size with greater than 50% respiratory variability, suggesting right atrial pressure of 3 mmHg.   Comparison(s): Prior study 04/18/2021: LVEF 55%, G1DD, moderate LVH, Mild AS, moderate AR, Mild MR, Mild to moderate TR, no PHTN.   Conclusion(s)/Recommendation(s): Consider transesophageal echocardiogram to evaluate the tricuspid and aortic valves.  Stress Testing:  03/19/2014 :  1. Resting EKG SR with borderline first-degree AV block, left atrial enlargement, incomplete right bundle branch block. Stress EKG was non diagnostic for ischemia. No ST-T changes of ischemia noted with pharmacologic stress testing. Stress terminated due to completion of protocol. 2. The perfusion study demonstrated mild chest wall attenuation artifact in the anterior wall. There was small to  moderate-sized moderate ischemia involving the basal inferior and mid inferior and inferolateral wall. Dynamic gated images reveal mild inferior wall hypokinesis. Left ventricular ejection fraction was estimated to be 63%. This represents an intermediate risk study.  Heart Catheterization: None  Carotid duplex: 06/24/2017: No hemodynamically significant arterial disease in the internal carotid artery bilaterally. Mild heteregenous plaque. Antegrade right vertebral artery flow. Antegrade left vertebral artery flow.  30 day event monitor 10/27-11/26/20: Normal sinus rhythm. 1 auto detected event occurred on day 18 at 0627 for NSR with first degree AV block. No patient triggered events occurred. Min HR was 49 bpm on day 19 at 0626. No A fib or high degree AV block was noted  LABORATORY DATA:  HDL 47 MG/DL 0/08/270 LDL 53.664 mg 05/01/2019 Cholesterol, total 142.000 m 05/01/2019 Triglycerides 85.000 05/01/2019 A1C 6.800 % 05/01/2019 Glucose Random N 05/08/2019 MicroAlbumin Urine 6.000 05/08/2019 MicroAlbumin/Creat 7.6 MG/DL 11/27/4740 BUN 59.563 mg 05/01/2019 Creatinine, Serum 0.900 mg/ 05/01/2019  IMPRESSION:    ICD-10-CM   1. Moderate aortic regurgitation  I35.1 ECHOCARDIOGRAM COMPLETE    2. Essential hypertension  I10     3. Controlled type 2 diabetes mellitus without complication, without long-term current use of insulin (HCC)  E11.9     4. Former smoker  Z87.891        RECOMMENDATIONS: [name] is a 87 y.o. male whose past medical history and cardiovascular risk factors include: Diet-controlled type II diabetes mellitus, hyperlipidemia, hypertension, moderate aortic regurgitation, previous episodes of syncope felt to be secondary to medication, former smoker, advanced age.  Patient had an echocardiogram in August 2022 to reevaluate the severity of aortic regurgitation.  The echocardiogram results along with the images reviewed with the patient at today's office visit.  The severity of  aortic regurgitation appears to be at least moderate I would recommend longitudinal follow-up.  However, patient was noted to have an incidental finding on the tricuspid valve as discussed above.  Recommended undergoing transesophageal echocardiogram to reevaluate the tricuspid and aortic valve.  However, patient would like to avoid invasive procedures as he is the only caretaker of his disabled son and does not have much family /friend support and clinically he is doing well and able to do the activities as desired.  Patient denies any prior history of infection or infective endocarditis.  Denies any fevers of unknown origin.  Recommend follow-up echocardiogram in 1 year to reevaluate the severity of aortic regurgitation or sooner if change in clinical status.  I have asked the patient if he would like me to discuss the findings of the echocardiogram with either his next of kin or close family/friends to help him make an informed decision.  Patient denies such interventions.  Total time spent: 32 minutes.   FINAL MEDICATION LIST END OF ENCOUNTER: No orders of the defined types were placed in this encounter.   Medications Discontinued During This Encounter  Medication Reason   metFORMIN (GLUCOPHAGE-XR) 500 MG 24 hr tablet      Current Outpatient Medications:    acetaminophen (TYLENOL) 500 MG tablet, Take 500 mg by mouth daily as needed., Disp: , Rfl:    amLODipine (NORVASC)  5 MG tablet, TAKE 1 TABLET DAILY, Disp: 90 tablet, Rfl: 1   aspirin EC 81 MG tablet, Take 81 mg by mouth daily., Disp: , Rfl:    losartan (COZAAR) 50 MG tablet, Take 1 tablet (50 mg total) by mouth daily., Disp: 90 tablet, Rfl: 3   metoprolol succinate (TOPROL-XL) 50 MG 24 hr tablet, Take 50 mg by mouth daily. Take with or immediately following a meal., Disp: , Rfl:    nitroGLYCERIN (NITROSTAT) 0.4 MG SL tablet, PLACE 1 TABLET UNDER THE TONGUE AS NEEDED FOR CHEST PAIN, Disp: , Rfl: 1   triamterene-hydrochlorothiazide  (MAXZIDE-25) 37.5-25 MG tablet, Take 1 tablet by mouth daily. , Disp: , Rfl:    Vitamin D, Ergocalciferol, (DRISDOL) 1.25 MG (50000 UNIT) CAPS capsule, Take 50,000 Units by mouth every 7 (seven) days., Disp: , Rfl:    VYTORIN 10-40 MG tablet, Take 0.5 tablets by mouth daily. , Disp: , Rfl:   Orders Placed This Encounter  Procedures   ECHOCARDIOGRAM COMPLETE   --Continue cardiac medications as reconciled in final medication list. --Return in about 55 weeks (around 08/06/2022) for Follow up aortic regurgitation. Or sooner if needed. --Continue follow-up with your primary care physician regarding the management of your other chronic comorbid conditions.  Patient's questions and concerns were addressed to his satisfaction. He voices understanding of the instructions provided during this encounter.   This note was created using a voice recognition software as a result there may be grammatical errors inadvertently enclosed that do not reflect the nature of this encounter. Every attempt is made to correct such errors.  Tessa Lerner, [place], [facility]  Pager: [phone] Office: [phone]

## 2021-07-19 ENCOUNTER — Other Ambulatory Visit: Payer: Self-pay

## 2021-07-19 DIAGNOSIS — I351 Nonrheumatic aortic (valve) insufficiency: Secondary | ICD-10-CM

## 2021-08-12 ENCOUNTER — Other Ambulatory Visit: Payer: Self-pay | Admitting: Cardiology

## 2021-09-09 ENCOUNTER — Other Ambulatory Visit: Payer: Self-pay | Admitting: Cardiology

## 2021-11-08 ENCOUNTER — Ambulatory Visit: Payer: Medicare Other | Admitting: Student

## 2021-11-08 ENCOUNTER — Encounter: Payer: Self-pay | Admitting: Student

## 2021-11-08 ENCOUNTER — Inpatient Hospital Stay: Payer: Medicare Other

## 2021-11-08 ENCOUNTER — Other Ambulatory Visit: Payer: Self-pay

## 2021-11-08 VITALS — BP 136/64 | Temp 98.1°F | Resp 16 | Ht 68.0 in | Wt 185.0 lb

## 2021-11-08 DIAGNOSIS — R002 Palpitations: Secondary | ICD-10-CM

## 2021-11-08 DIAGNOSIS — I10 Essential (primary) hypertension: Secondary | ICD-10-CM

## 2021-11-08 NOTE — Progress Notes (Signed)
? ?[name] ?Date of Birth: Oct 29, 1930 ?MRN: [id number] ?Primary Care Provider:Paterson, Barry Dienes, MD ?Former Cardiology Providers: Altamese Barbour, APRN, FNP-C ?Primary Cardiologist: Rayford Halsted, PA-C, [facility] (established care 05/03/2020) ? ?Date: 11/08/21 ?Last Office Visit: 04/20/2021 ? ?Chief Complaint  ?Patient presents with  ? Irregular Heart Beat  ? Follow-up  ? ? ?HPI  ?[name] is a 88 y.o.  male who presents to the office with a chief complaint of " reviewed the results of the echocardiogram.." Patient's past medical history and cardiovascular risk factors include: Diet-controlled type II diabetes mellitus, hyperlipidemia, hypertension, moderate aortic regurgitation, history of syncope, former smoker, advanced age. ? ?Patient follows with our office for valvular heart disease.  Transthoracic echocardiogram in October 2022 revealed moderate aortic regurgitation as well as incidental echodensity on the tricuspid valve.  Patient and Dr. Odis Hollingshead discussed undergoing transesophageal echocardiogram, however patient opted out of this and shared decision was instead to follow-up in 1 year to reevaluate severity of aortic regurgitation. ? ?Patient presents today for urgent visit at his request with complaints of palpitations over the last 3 to 4 days.  He states these episodes are frequent occurring several times per day and lasting anywhere from several minutes to a few hours.  He denies associated symptoms.  States he feels like his heart is "skipping beats".  Denies chest pain, dyspnea.  He is overall euvolemic on exam. ? ?Notably patient is primary caretaker of his disabled son. ? ?FUNCTIONAL STATUS: He is active for age (manages 2 acer and dose yardwork).   ? ?ALLERGIES: ?Allergies  ?Allergen Reactions  ? Atorvastatin   ?  Other reaction(s): myalgias  ? ? ? ?MEDICATION LIST PRIOR TO VISIT: ?Current Outpatient Medications on File Prior to Visit  ?Medication Sig Dispense Refill  ? acetaminophen (TYLENOL)  500 MG tablet Take 500 mg by mouth daily as needed.    ? amLODipine (NORVASC) 5 MG tablet TAKE 1 TABLET DAILY 90 tablet 3  ? aspirin EC 81 MG tablet Take 81 mg by mouth daily.    ? losartan (COZAAR) 50 MG tablet TAKE 1 TABLET DAILY 90 tablet 3  ? metFORMIN (GLUCOPHAGE-XR) 500 MG 24 hr tablet Take 500 mg by mouth daily.    ? metoprolol succinate (TOPROL-XL) 50 MG 24 hr tablet Take 50 mg by mouth daily. Take with or immediately following a meal.    ? nitroGLYCERIN (NITROSTAT) 0.4 MG SL tablet PLACE 1 TABLET UNDER THE TONGUE AS NEEDED FOR CHEST PAIN  1  ? triamterene-hydrochlorothiazide (MAXZIDE-25) 37.5-25 MG tablet Take 1 tablet by mouth daily.     ? Vitamin D, Ergocalciferol, (DRISDOL) 1.25 MG (50000 UNIT) CAPS capsule Take 50,000 Units by mouth every 7 (seven) days.    ? VYTORIN 10-40 MG tablet Take 0.5 tablets by mouth daily.     ? ?No current facility-administered medications on file prior to visit.  ? ? ?PAST MEDICAL HISTORY: ?Past Medical History:  ?Diagnosis Date  ? Aortic regurgitation   ? HOH (hard of hearing)   ? Hypertension   ? ? ?PAST SURGICAL HISTORY: ?Past Surgical History:  ?Procedure Laterality Date  ? ESOPHAGOGASTRODUODENOSCOPY N/A 11/30/2015  ? Procedure: ESOPHAGOGASTRODUODENOSCOPY (EGD);  Surgeon: Dorena Cookey, MD;  Location: [facility];  Service: Endoscopy;  Laterality: N/A;  ? NO PAST SURGERIES    ? ? ?FAMILY HISTORY: ?The patient's Family history is unknown by patient. ?  ?SOCIAL HISTORY:  ?The patient  reports that he quit smoking about 27 years ago. His smoking  use included cigarettes and pipe. He has a 12.50 pack-year smoking history. He quit smokeless tobacco use about 25 years ago. He reports that he does not drink alcohol and does not use drugs. ? ?Review of Systems  ?Constitutional: Negative for chills and fever.  ?HENT:  Negative for hoarse voice and nosebleeds.   ?     Hard of hearing.  ?Eyes:  Negative for discharge, double vision and pain.  ?Cardiovascular:  Positive for palpitations.  Negative for chest pain, claudication, dyspnea on exertion, leg swelling, near-syncope, orthopnea, paroxysmal nocturnal dyspnea and syncope.  ?Respiratory:  Negative for hemoptysis and shortness of breath.   ?Musculoskeletal:  Negative for muscle cramps and myalgias.  ?Gastrointestinal:  Negative for abdominal pain, constipation, diarrhea, hematemesis, hematochezia, melena, nausea and vomiting.  ?Neurological:  Negative for dizziness and light-headedness.  ? ?PHYSICAL EXAM: ?Vitals with BMI 11/08/2021 07/17/2021 04/20/2021  ?Height 5\' 8"  5\' 8"  5\' 8"   ?Weight 185 lbs 185 lbs 185 lbs  ?BMI 28.14 28.14 28.14  ?Systolic 136 138  ?Diastolic 64 75 67  ?Pulse - 72 68  ? ? ?CONSTITUTIONAL: Well-developed and well-nourished. No acute distress.  ?SKIN: Skin is warm and dry. No rash noted. No cyanosis. No pallor. No jaundice ?HEAD: Normocephalic and atraumatic.  ?EYES: No scleral icterus ?MOUTH/THROAT: Moist oral membranes.  ?NECK: No JVD present. No thyromegaly noted. No carotid bruits  ?LYMPHATIC: No visible cervical adenopathy.  ?CHEST Normal respiratory effort. No intercostal retractions  ?LUNGS: Clear to auscultation bilaterally.  No stridor. No wheezes. No rales.  ?CARDIOVASCULAR: Regular rate and rhythm, positive S1-S2, soft diastolic murmur heard at the second right intercostal space, no gallops or rubs. ?ABDOMINAL: No apparent ascites.  ?EXTREMITIES: No peripheral edema  ?HEMATOLOGIC: No significant bruising ?NEUROLOGIC: Oriented to person, place, and time. Nonfocal. Normal muscle tone.  ?PSYCHIATRIC: Normal mood and affect. Normal behavior. Cooperative ?Physical exam unchanged compared to previous office visit. ? ?CARDIAC DATABASE: ?EKG: ?04/20/2021: Normal sinus rhythm, 65 bpm, right bundle branch block, without underlying ischemia or injury pattern.  ?11/08/2021: Sinus rhythm at a rate of 72 bpm.  Normal axis.  Right bundle branch block.  Left atrial enlargement. ? ?Echocardiogram: ?03/03/2019: LVEF 55%, grade 1  diastolic impairment, moderate/eccentric AR.   ? ?04/18/2021: LVEF 55%, grade 1 diastolic impairment, moderate AR, mild to moderate TR, PASP 26 mmHg. ? ?06/12/2021: ?1. Left ventricular ejection fraction, by estimation, is 60 to 65%. The left ventricle has normal function. The left ventricle has no regional wall motion abnormalities. There is mild left ventricular hypertrophy. Left ventricular diastolic parameters  ?were normal. The average left ventricular global longitudinal strain is -26.0 %. The global longitudinal strain is normal. ?2. Right ventricular systolic function is normal. The right ventricular size is normal. There is normal pulmonary artery systolic pressure. ?3. The mitral valve is degenerative. Trivial mitral valve regurgitation. No evidence of mitral stenosis. ?4. Echo density noted on the tricuspid valve measuring 1.8x0.67cm (differtential includes but not limited to healed vegetation, calcium deposit, prolapse / flail leaflet, etc). ?5. The aortic valve is tricuspid. Aortic valve regurgitation atleast moderate, eccentric jet along the anterior mitral valve leaflet, holodiastolic flow within descending aorta is not seen. Mild aortic valve sclerosis is present, with no evidence of  ?aortic valve stenosis. ?6. The inferior vena cava is normal in size with greater than 50% respiratory variability, suggesting right atrial pressure of 3 mmHg. ?  ?Comparison(s): Prior study 04/18/2021: LVEF 55%, G1DD, moderate LVH, Mild AS, moderate AR, Mild MR, Mild to  moderate TR, no PHTN. ?  ?Conclusion(s)/Recommendation(s): Consider transesophageal echocardiogram to evaluate the tricuspid and aortic valves. ? ?Stress Testing:  ?03/19/2014 : ?1. Resting EKG SR with borderline first-degree AV block, left atrial enlargement, incomplete right bundle branch block. Stress EKG was non diagnostic for ischemia. No ST-T changes of ischemia noted with pharmacologic stress testing. Stress terminated due to completion of  protocol. ?2. The perfusion study demonstrated mild chest wall attenuation artifact in the anterior wall. There was small to moderate-sized moderate ischemia involving the basal inferior and mid inferior and i

## 2021-11-27 ENCOUNTER — Telehealth: Payer: Self-pay

## 2021-11-27 NOTE — Telephone Encounter (Signed)
Patient has only taken 25 mg on metoprolol today and blood pressure is 140/68 mmHg at the time of my call with him. Patient complaining of palpitations intermittent since last night without other associated symptoms. Advised patient may take additional 25 mg of metoprolol today. Results of cardiac monitor are pending. Counseled patient regarding signs and symptoms that would warrant urgent or emergent evaluation, he verbalized understanding and agreement.  ?

## 2021-12-06 NOTE — Progress Notes (Signed)
Called pt no answer, left a vm

## 2021-12-08 NOTE — Progress Notes (Signed)
Called pt no answer, left a vm

## 2021-12-11 ENCOUNTER — Telehealth: Payer: Self-pay | Admitting: Student

## 2021-12-11 NOTE — Telephone Encounter (Signed)
Note  ?Patient asked to speak with [name]. He did not specify if it is in regards to medication or symptoms he's having. Can call him at the number provided. ?

## 2021-12-11 NOTE — Telephone Encounter (Signed)
Patient asked to speak with Celeste. He did not specify if it is in regards to medication or symptoms he's having. Can call him at the number provided. ?

## 2021-12-12 NOTE — Telephone Encounter (Signed)
[name] called and spoke to pt.

## 2021-12-13 NOTE — Progress Notes (Signed)
Celeste Cantwell PA-C called and discussed monitor results with the pt. Pt aware.

## 2021-12-22 ENCOUNTER — Ambulatory Visit: Payer: Medicare Other | Admitting: Cardiology

## 2021-12-22 ENCOUNTER — Encounter: Payer: Self-pay | Admitting: Cardiology

## 2021-12-22 VITALS — BP 132/83 | HR 63 | Temp 97.0°F | Resp 16 | Ht 68.0 in | Wt 188.0 lb

## 2021-12-22 DIAGNOSIS — I351 Nonrheumatic aortic (valve) insufficiency: Secondary | ICD-10-CM

## 2021-12-22 DIAGNOSIS — I4729 Other ventricular tachycardia: Secondary | ICD-10-CM

## 2021-12-22 DIAGNOSIS — I10 Essential (primary) hypertension: Secondary | ICD-10-CM

## 2021-12-22 DIAGNOSIS — Z87891 Personal history of nicotine dependence: Secondary | ICD-10-CM

## 2021-12-22 DIAGNOSIS — E119 Type 2 diabetes mellitus without complications: Secondary | ICD-10-CM

## 2021-12-22 DIAGNOSIS — I493 Ventricular premature depolarization: Secondary | ICD-10-CM

## 2021-12-22 MED ORDER — METOPROLOL SUCCINATE ER 50 MG PO TB24: 50.0000 mg | ORAL_TABLET | Freq: Two times a day (BID) | ORAL | 0 refills | Status: DC

## 2021-12-22 NOTE — Progress Notes (Signed)
? ?[name] ?Date of Birth: [birth year] ?MRN: [id number] ?Primary Care Provider:Paterson, Barry Dienes, MD ?Former Cardiology Providers: Altamese Woodruff, APRN, FNP-C ?Primary Cardiologist: Tessa Lerner, DO, [facility] (established care 05/03/2020) ? ?Date: 12/22/21 ?Last Office Visit: 07/17/2021 ? ?Chief Complaint  ?Patient presents with  ? Follow-up  ?  Palpitations  ?Monitor results.   ? ? ?HPI  ?[name] is a 88 y.o.  male whose past medical history and cardiovascular risk factors include: Diet-controlled type II diabetes mellitus, hyperlipidemia, hypertension, moderate aortic regurgitation, history of syncope, former smoker, advanced age. ? ?Patient has known history of moderate aortic regurgitation which is being managed chronically by annual echocardiograms.  Last echocardiogram noted an echodensity on the tricuspid valve the patient was recommended to undergo transesophageal echocardiogram for further evaluation.  However patient refused. ? ?He presented to the office in March 2023 for symptoms of palpitations/irregular heartbeat.  He underwent a extended Holter which noted PVC burden of 10.7% and asymptomatic episodes of nonsustained ventricular tachycardia.  He was taking Toprol-XL 25 mg p.o. daily which was increased to 50 mg p.o. daily.  Since the uptitration symptoms of irregular heartbeat/skipped beats have improved but not resolved. ? ?He denies anginal discomfort, near-syncope or syncopal event.  Overall functional capacity remains stable.  ? ?FUNCTIONAL STATUS: He is active for age (manages 2 acer and dose yardwork).   ? ?ALLERGIES: ?Allergies  ?Allergen Reactions  ? Atorvastatin   ?  Other reaction(s): myalgias  ? ? ? ?MEDICATION LIST PRIOR TO VISIT: ?Current Outpatient Medications on File Prior to Visit  ?Medication Sig Dispense Refill  ? acetaminophen (TYLENOL) 500 MG tablet Take 500 mg by mouth daily as needed.    ? amLODipine (NORVASC) 5 MG tablet TAKE 1 TABLET DAILY 90 tablet 3  ? aspirin EC 81 MG  tablet Take 81 mg by mouth daily.    ? losartan (COZAAR) 50 MG tablet TAKE 1 TABLET DAILY 90 tablet 3  ? metFORMIN (GLUCOPHAGE-XR) 500 MG 24 hr tablet Take 500 mg by mouth daily.    ? nitroGLYCERIN (NITROSTAT) 0.4 MG SL tablet PLACE 1 TABLET UNDER THE TONGUE AS NEEDED FOR CHEST PAIN  1  ? triamterene-hydrochlorothiazide (MAXZIDE-25) 37.5-25 MG tablet Take 1 tablet by mouth daily.     ? Vitamin D, Ergocalciferol, (DRISDOL) 1.25 MG (50000 UNIT) CAPS capsule Take 50,000 Units by mouth every 7 (seven) days.    ? VYTORIN 10-40 MG tablet Take 0.5 tablets by mouth daily.     ? ?No current facility-administered medications on file prior to visit.  ? ? ?PAST MEDICAL HISTORY: ?Past Medical History:  ?Diagnosis Date  ? Aortic regurgitation   ? HOH (hard of hearing)   ? Hypertension   ? ? ?PAST SURGICAL HISTORY: ?Past Surgical History:  ?Procedure Laterality Date  ? ESOPHAGOGASTRODUODENOSCOPY N/A 11/30/2015  ? Procedure: ESOPHAGOGASTRODUODENOSCOPY (EGD);  Surgeon: Dorena Cookey, MD;  Location: [facility];  Service: Endoscopy;  Laterality: N/A;  ? NO PAST SURGERIES    ? ? ?FAMILY HISTORY: ?The patient's Family history is unknown by patient. ?  ?SOCIAL HISTORY:  ?The patient  reports that he quit smoking about 27 years ago. His smoking use included cigarettes and pipe. He has a 12.50 pack-year smoking history. He quit smokeless tobacco use about 26 years ago. He reports that he does not drink alcohol and does not use drugs. ? ?Review of Systems  ?Constitutional: Negative for chills and fever.  ?HENT:  Negative for hoarse voice and nosebleeds.   ?  Hard of hearing.  ?Eyes:  Negative for discharge, double vision and pain.  ?Cardiovascular:  Positive for palpitations. Negative for chest pain, claudication, dyspnea on exertion, leg swelling, near-syncope, orthopnea, paroxysmal nocturnal dyspnea and syncope.  ?Respiratory:  Negative for hemoptysis and shortness of breath.   ?Musculoskeletal:  Negative for muscle cramps and myalgias.   ?Gastrointestinal:  Negative for abdominal pain, constipation, diarrhea, hematemesis, hematochezia, melena, nausea and vomiting.  ?Neurological:  Negative for dizziness and light-headedness.  ? ?PHYSICAL EXAM: ? ?  12/22/2021  ? 11:31 AM 11/08/2021  ?  9:57 AM 07/17/2021  ?  2:11 PM  ?Vitals with BMI  ?Height 5\' 8"  5\' 8"  5\' 8"   ?Weight 188 lbs 185 lbs 185 lbs  ?BMI 28.59 28.14 28.14  ?Systolic 132 136  ?Diastolic 83 64 75  ?Pulse 63  72  ? ? ?CONSTITUTIONAL: Age-appropriate, hemodynamically stable, No acute distress.  ?SKIN: Skin is warm and dry. No rash noted. No cyanosis. No pallor. No jaundice ?HEAD: Normocephalic and atraumatic.  ?EYES: No scleral icterus ?MOUTH/THROAT: Moist oral membranes.  ?NECK: No JVD present. No thyromegaly noted. No carotid bruits  ?LYMPHATIC: No visible cervical adenopathy.  ?CHEST Normal respiratory effort. No intercostal retractions  ?LUNGS: Clear to auscultation bilaterally.  No stridor. No wheezes. No rales.  ?CARDIOVASCULAR: Regular rate and rhythm, positive S1-S2, soft diastolic murmur heard at the second right intercostal space, no gallops or rubs. ?ABDOMINAL: Soft, nontender, nondistended, positive bowel sounds in all 4 quadrants, no apparent ascites.  ?EXTREMITIES: No peripheral edema, warm to touch ?HEMATOLOGIC: No significant bruising ?NEUROLOGIC: Oriented to person, place, and time. Nonfocal. Normal muscle tone.  ?PSYCHIATRIC: Normal mood and affect. Normal behavior. Cooperative ? ?CARDIAC DATABASE: ?EKG: ?04/20/2021: Normal sinus rhythm, 65 bpm, right bundle branch block, without underlying ischemia or injury pattern.  ? ?Echocardiogram: ?03/03/2019: LVEF 55%, grade 1 diastolic impairment, moderate/eccentric AR.   ? ?04/18/2021: LVEF 55%, grade 1 diastolic impairment, moderate AR, mild to moderate TR, PASP 26 mmHg. ? ?06/12/2021: ?1. Left ventricular ejection fraction, by estimation, is 60 to 65%. The left ventricle has normal function. The left ventricle has no regional  wall motion abnormalities. There is mild left ventricular hypertrophy. Left ventricular diastolic parameters  ?were normal. The average left ventricular global longitudinal strain is -26.0 %. The global longitudinal strain is normal. ?2. Right ventricular systolic function is normal. The right ventricular size is normal. There is normal pulmonary artery systolic pressure. ?3. The mitral valve is degenerative. Trivial mitral valve regurgitation. No evidence of mitral stenosis. ?4. Echo density noted on the tricuspid valve measuring 1.8x0.67cm (differtential includes but not limited to healed vegetation, calcium deposit, prolapse / flail leaflet, etc). ?5. The aortic valve is tricuspid. Aortic valve regurgitation atleast moderate, eccentric jet along the anterior mitral valve leaflet, holodiastolic flow within descending aorta is not seen. Mild aortic valve sclerosis is present, with no evidence of  ?aortic valve stenosis. ?6. The inferior vena cava is normal in size with greater than 50% respiratory variability, suggesting right atrial pressure of 3 mmHg. ?  ?Comparison(s): Prior study 04/18/2021: LVEF 55%, G1DD, moderate LVH, Mild AS, moderate AR, Mild MR, Mild to moderate TR, no PHTN. ?  ?Conclusion(s)/Recommendation(s): Consider transesophageal echocardiogram to evaluate the tricuspid and aortic valves. ? ?Stress Testing:  ?03/19/2014 : ?1. Resting EKG SR with borderline first-degree AV block, left atrial enlargement, incomplete right bundle branch block. Stress EKG was non diagnostic for ischemia. No ST-T changes of ischemia noted with pharmacologic stress testing. Stress terminated due  to completion of protocol. ?2. The perfusion study demonstrated mild chest wall attenuation artifact in the anterior wall. There was small to moderate-sized moderate ischemia involving the basal inferior and mid inferior and inferolateral wall. Dynamic gated images reveal mild inferior wall hypokinesis. Left ventricular ejection  fraction was estimated to be 63%. This represents an intermediate risk study. ? ?Heart Catheterization: ?None ? ?Carotid duplex: ?06/24/2017: ?No hemodynamically significant arterial disease in the internal

## 2022-01-16 ENCOUNTER — Other Ambulatory Visit: Payer: Self-pay | Admitting: Cardiology

## 2022-01-16 DIAGNOSIS — I493 Ventricular premature depolarization: Secondary | ICD-10-CM

## 2022-01-16 DIAGNOSIS — I4729 Other ventricular tachycardia: Secondary | ICD-10-CM

## 2022-01-23 ENCOUNTER — Other Ambulatory Visit: Payer: Self-pay

## 2022-01-23 DIAGNOSIS — I4729 Other ventricular tachycardia: Secondary | ICD-10-CM

## 2022-01-23 DIAGNOSIS — I493 Ventricular premature depolarization: Secondary | ICD-10-CM

## 2022-01-23 MED ORDER — METOPROLOL SUCCINATE ER 50 MG PO TB24: ORAL_TABLET | ORAL | 3 refills | Status: DC

## 2022-02-05 ENCOUNTER — Encounter: Payer: Self-pay | Admitting: Cardiology

## 2022-02-05 ENCOUNTER — Ambulatory Visit: Payer: Medicare Other | Admitting: Cardiology

## 2022-02-05 ENCOUNTER — Telehealth: Payer: Self-pay | Admitting: Cardiology

## 2022-02-05 VITALS — BP 128/53 | HR 60 | Resp 16 | Ht 68.0 in | Wt 186.0 lb

## 2022-02-05 DIAGNOSIS — I4729 Other ventricular tachycardia: Secondary | ICD-10-CM

## 2022-02-05 DIAGNOSIS — I351 Nonrheumatic aortic (valve) insufficiency: Secondary | ICD-10-CM

## 2022-02-05 DIAGNOSIS — Z87891 Personal history of nicotine dependence: Secondary | ICD-10-CM

## 2022-02-05 DIAGNOSIS — E119 Type 2 diabetes mellitus without complications: Secondary | ICD-10-CM

## 2022-02-05 DIAGNOSIS — I10 Essential (primary) hypertension: Secondary | ICD-10-CM

## 2022-02-05 DIAGNOSIS — I493 Ventricular premature depolarization: Secondary | ICD-10-CM

## 2022-02-05 MED ORDER — METOPROLOL SUCCINATE ER 50 MG PO TB24: 50.0000 mg | ORAL_TABLET | Freq: Every morning | ORAL | 0 refills | Status: DC

## 2022-02-05 NOTE — Telephone Encounter (Signed)
Patient saw ST today, said he was told to call back with the strength for metoprolol he's supposed to be taking. He says it's 74 MG, uses [facility] mail order for pharmacy. Patient needs refill.

## 2022-02-05 NOTE — Telephone Encounter (Signed)
Does he take one tablet or two per day? Please clarify.   ST

## 2022-02-05 NOTE — Telephone Encounter (Signed)
He has been taking two per day

## 2022-02-05 NOTE — Telephone Encounter (Signed)
Please inform the patient that I sent in the prescription for Toprol-XL 50 mg p.o. daily.

## 2022-02-05 NOTE — Progress Notes (Signed)
[name] Date of Birth: Nov 06, 1930 MRN: [id number] Primary Care Provider:Paterson, Barry Dienes, MD Former Cardiology Providers: Altamese [place], APRN, FNP-C Primary Cardiologist: Tessa Lerner, DO, [facility] (established care 05/03/2020)  Date: 02/05/22 Last Office Visit: 07/17/2021  Chief Complaint  Patient presents with   Premature ventricular contraction   Follow-up    HPI  [name] is a 88 y.o.  male whose past medical history and cardiovascular risk factors include: Diet-controlled type II diabetes mellitus, hyperlipidemia, hypertension, moderate aortic regurgitation, history of syncope, former smoker, advanced age.  Patient is being followed for management of aortic regurgitation which has been managed medically and with surveillance echocardiograms.  During his last echocardiogram in October 2022 he was noted to have moderate aortic regurgitation and echodensity on the tricuspid valve.  Recommended transesophageal echocardiogram for further evaluation the patient has refused any additional invasive testing.  Since then he presented to the hospital in March 2023 for symptoms of irregular heart rate/palpitations.  Extended Holter monitor was performed which noted a PVC burden of approximately 10.7% and episodes of NSVT.  His Toprol-XL was increased from 25 to 50 mg p.o. daily.  Patient feels less palpitations compared to prior, feels more energetic, but recently has been feeling more tired and fatigue.  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  I suspect majority of his tired and fatigue is probably coming from poor sleep hygiene.  He averages no more than 3 to 4 hours of sleep per day.  He denies angina pectoris or heart failure symptoms.  He was supposed to have stress test done given his NSVT however this is still pending.  FUNCTIONAL STATUS: He is active for age (manages 2 acer and dose yardwork).    ALLERGIES: Allergies   Allergen Reactions   Atorvastatin     Other reaction(s): myalgias     MEDICATION LIST PRIOR TO VISIT: Current Outpatient Medications on File Prior to Visit  Medication Sig Dispense Refill   acetaminophen (TYLENOL) 500 MG tablet Take 500 mg by mouth daily as needed.     amLODipine (NORVASC) 5 MG tablet TAKE 1 TABLET DAILY 90 tablet 3   aspirin EC 81 MG tablet Take 81 mg by mouth daily.     losartan (COZAAR) 50 MG tablet TAKE 1 TABLET DAILY 90 tablet 3   metFORMIN (GLUCOPHAGE-XR) 500 MG 24 hr tablet Take 500 mg by mouth daily.     nitroGLYCERIN (NITROSTAT) 0.4 MG SL tablet PLACE 1 TABLET UNDER THE TONGUE AS NEEDED FOR CHEST PAIN  1   triamterene-hydrochlorothiazide (MAXZIDE-25) 37.5-25 MG tablet Take 1 tablet by mouth daily.      Vitamin D, Ergocalciferol, (DRISDOL) 1.25 MG (50000 UNIT) CAPS capsule Take 50,000 Units by mouth every 7 (seven) days.     VYTORIN 10-40 MG tablet Take 0.5 tablets by mouth daily.      No current facility-administered medications on file prior to visit.    PAST MEDICAL HISTORY: Past Medical History:  Diagnosis Date   Aortic regurgitation    HOH (hard of hearing)    Hypertension    Premature ventricular contractions     PAST SURGICAL HISTORY: Past Surgical History:  Procedure Laterality Date   ESOPHAGOGASTRODUODENOSCOPY N/A 11/30/2015   Procedure: ESOPHAGOGASTRODUODENOSCOPY (EGD);  Surgeon: Dorena Cookey, MD;  Location: [facility];  Service: Endoscopy;  Laterality: N/A;   NO PAST SURGERIES      FAMILY HISTORY: No family history of premature coronary disease  or sudden cardiac death.   SOCIAL HISTORY:  The patient  reports that he quit smoking about 27 years ago. His smoking use included cigarettes and pipe. He has a 12.50 pack-year smoking history. He quit smokeless tobacco use about 26 years ago. He reports that he does not drink alcohol and does not use drugs.  Review of Systems  HENT:         Hard of hearing  Cardiovascular:  Negative for chest  pain, dyspnea on exertion, leg swelling, near-syncope, orthopnea, palpitations, paroxysmal nocturnal dyspnea and syncope.    PHYSICAL EXAM:    02/05/2022   11:40 AM 12/22/2021   11:31 AM 11/08/2021    9:57 AM  Vitals with BMI  Height 5\' 8"  5\' 8"  5\' 8"   Weight 186 lbs 188 lbs 185 lbs  BMI 28.29 28.59 28.14  Systolic 128 132  Diastolic 53 83 64  Pulse 60 63     CONSTITUTIONAL: Age-appropriate, hemodynamically stable, No acute distress.  SKIN: Skin is warm and dry. No rash noted. No cyanosis. No pallor. No jaundice HEAD: Normocephalic and atraumatic.  EYES: No scleral icterus MOUTH/THROAT: Moist oral membranes.  NECK: No JVD present. No thyromegaly noted. No carotid bruits  CHEST Normal respiratory effort. No intercostal retractions  LUNGS: Clear to auscultation bilaterally.  No stridor. No wheezes. No rales.  CARDIOVASCULAR: Bradycardia, positive S1-S2, soft diastolic murmur heard at the second right intercostal space, no gallops or rubs. ABDOMINAL: Soft, nontender, nondistended, positive bowel sounds in all 4 quadrants, no apparent ascites.  EXTREMITIES: No peripheral edema, warm to touch HEMATOLOGIC: No significant bruising NEUROLOGIC: Oriented to person, place, and time. Nonfocal. Normal muscle tone.  PSYCHIATRIC: Normal mood and affect. Normal behavior. Cooperative.  No significant change in physical lamination since last office visit.  CARDIAC DATABASE: EKG: 02/05/2022: Sinus bradycardia, right bundle branch block, occasional PVCs.  Echocardiogram: 03/03/2019: LVEF 55%, grade 1 diastolic impairment, moderate/eccentric AR.    04/18/2021: LVEF 55%, grade 1 diastolic impairment, moderate AR, mild to moderate TR, PASP 26 mmHg.  06/12/2021: 1. Left ventricular ejection fraction, by estimation, is 60 to 65%. The left ventricle has normal function. The left ventricle has no regional wall motion abnormalities. There is mild left ventricular hypertrophy. Left ventricular  diastolic parameters  were normal. The average left ventricular global longitudinal strain is -26.0 %. The global longitudinal strain is normal. 2. Right ventricular systolic function is normal. The right ventricular size is normal. There is normal pulmonary artery systolic pressure. 3. The mitral valve is degenerative. Trivial mitral valve regurgitation. No evidence of mitral stenosis. 4. Echo density noted on the tricuspid valve measuring 1.8x0.67cm (differtential includes but not limited to healed vegetation, calcium deposit, prolapse / flail leaflet, etc). 5. The aortic valve is tricuspid. Aortic valve regurgitation atleast moderate, eccentric jet along the anterior mitral valve leaflet, holodiastolic flow within descending aorta is not seen. Mild aortic valve sclerosis is present, with no evidence of  aortic valve stenosis. 6. The inferior vena cava is normal in size with greater than 50% respiratory variability, suggesting right atrial pressure of 3 mmHg.   Comparison(s): Prior study 04/18/2021: LVEF 55%, G1DD, moderate LVH, Mild AS, moderate AR, Mild MR, Mild to moderate TR, no PHTN.   Conclusion(s)/Recommendation(s): Consider transesophageal echocardiogram to evaluate the tricuspid and aortic valves.  Stress Testing:  03/19/2014 : 1. Resting EKG SR with borderline first-degree AV block, left atrial enlargement, incomplete right bundle branch block. Stress EKG was non diagnostic for ischemia. No ST-T changes of  ischemia noted with pharmacologic stress testing. Stress terminated due to completion of protocol. 2. The perfusion study demonstrated mild chest wall attenuation artifact in the anterior wall. There was small to moderate-sized moderate ischemia involving the basal inferior and mid inferior and inferolateral wall. Dynamic gated images reveal mild inferior wall hypokinesis. Left ventricular ejection fraction was estimated to be 63%. This represents an intermediate risk study.  Heart  Catheterization: None  Carotid duplex: 06/24/2017: No hemodynamically significant arterial disease in the internal carotid artery bilaterally. Mild heteregenous plaque. Antegrade right vertebral artery flow. Antegrade left vertebral artery flow.  Ambulatory cardiac telemetry 11/08/2021 - 11/22/2021 (14 days): Predominant underlying rhythm was sinus with 3 asymptomatic episodes of ventricular tachycardia with the longest lasting 10 beats at a maximum rate of 190 bpm.  18 episodes of supraventricular tachycardia versus atrial tachycardia with the longest lasting 16 beats.  Rare PACs, frequent PVCs, ventricular bigeminy and trigeminy were present.  Patient symptoms correlated with sinus rhythm, PVCs, and ventricular bigeminy.   LABORATORY DATA:  HDL 47 MG/DL 8/6/57849/11/2018 LDL 69.62978.000 mg 05/01/2019 Cholesterol, total 142.000 m 05/01/2019 Triglycerides 85.000 05/01/2019 A1C 6.800 % 05/01/2019 Glucose Random N 05/08/2019 MicroAlbumin Urine 6.000 05/08/2019 MicroAlbumin/Creat 7.6 MG/DL 5/28/41329/06/2019 BUN 44.01013.000 mg 05/01/2019 Creatinine, Serum 0.900 mg/ 05/01/2019  IMPRESSION:    ICD-10-CM   1. Premature ventricular contraction  I49.3 Ambulatory referral to Sleep Studies    PCV MYOCARDIAL PERFUSION WITH LEXISCAN    metoprolol succinate (TOPROL-XL) 50 MG 24 hr tablet    2. NSVT (nonsustained ventricular tachycardia) (HCC)  I47.29 Ambulatory referral to Sleep Studies    PCV MYOCARDIAL PERFUSION WITH LEXISCAN    metoprolol succinate (TOPROL-XL) 50 MG 24 hr tablet    3. Moderate aortic regurgitation  I35.1     4. Essential hypertension  I10 EKG 12-Lead    5. Controlled type 2 diabetes mellitus without complication, without long-term current use of insulin (HCC)  E11.9     6. Former smoker  Z87.891        RECOMMENDATIONS: [name] is a 88 y.o. male whose past medical history and cardiovascular risk factors include: Diet-controlled type II diabetes mellitus, hyperlipidemia, hypertension, moderate aortic  regurgitation, previous episodes of syncope felt to be secondary to medication, former smoker, advanced age.  Premature ventricular contraction/NSVT: Due to symptoms of palpitations and irregular heartbeat a Zio patch was performed.  PVC burden approximately 10% with episodes of NSVT. Patient is currently taking metoprolol succinate 100 mg p.o. daily. Because of his symptoms as mentioned above we will reduce the Toprol XL to 50 mg p.o. daily. In addition, I would like him to be evaluated for sleep apnea or sleep-related disorders given his poor sleep hygiene, age, sleep patterns, PVCs and NSVT, recommend sleep study.  Will refer to sleep medicine. Nuclear stress test is still pending.  Patient is unable to exercise and uses a cane for ambulation and therefore pharmacological stress test recommended given his NSVT.  Further recommendations to follow  Moderate aortic regurgitation Asymptomatic. No significant pulse pressure difference. Repeat echocardiogram in November 2023 We will follow peripherally.  Essential hypertension Office blood pressures are well controlled. Medications reconciled.  Controlled type 2 diabetes mellitus without complication, without long-term current use of insulin (HCC) Reemphasized the importance of glycemic control. Currently managed by primary care provider.   FINAL MEDICATION LIST END OF ENCOUNTER: Meds ordered this encounter  Medications   metoprolol succinate (TOPROL-XL) 50 MG 24 hr tablet    Sig: Take 1 tablet (50 mg  total) by mouth in the morning. Take with or immediately following a meal.    Dispense:  90 tablet    Refill:  0    Medications Discontinued During This Encounter  Medication Reason   metoprolol succinate (TOPROL-XL) 50 MG 24 hr tablet Dose change      Current Outpatient Medications:    acetaminophen (TYLENOL) 500 MG tablet, Take 500 mg by mouth daily as needed., Disp: , Rfl:    amLODipine (NORVASC) 5 MG tablet, TAKE 1 TABLET DAILY,  Disp: 90 tablet, Rfl: 3   aspirin EC 81 MG tablet, Take 81 mg by mouth daily., Disp: , Rfl:    losartan (COZAAR) 50 MG tablet, TAKE 1 TABLET DAILY, Disp: 90 tablet, Rfl: 3   metFORMIN (GLUCOPHAGE-XR) 500 MG 24 hr tablet, Take 500 mg by mouth daily., Disp: , Rfl:    nitroGLYCERIN (NITROSTAT) 0.4 MG SL tablet, PLACE 1 TABLET UNDER THE TONGUE AS NEEDED FOR CHEST PAIN, Disp: , Rfl: 1   triamterene-hydrochlorothiazide (MAXZIDE-25) 37.5-25 MG tablet, Take 1 tablet by mouth daily. , Disp: , Rfl:    Vitamin D, Ergocalciferol, (DRISDOL) 1.25 MG (50000 UNIT) CAPS capsule, Take 50,000 Units by mouth every 7 (seven) days., Disp: , Rfl:    VYTORIN 10-40 MG tablet, Take 0.5 tablets by mouth daily. , Disp: , Rfl:    metoprolol succinate (TOPROL-XL) 50 MG 24 hr tablet, Take 1 tablet (50 mg total) by mouth in the morning. Take with or immediately following a meal., Disp: 90 tablet, Rfl: 0  Orders Placed This Encounter  Procedures   Ambulatory referral to Sleep Studies   PCV MYOCARDIAL PERFUSION WITH LEXISCAN   EKG 12-Lead   --Continue cardiac medications as reconciled in final medication list. --Return in about 6 months (around 08/07/2022) for Follow up PVC, review echo results. Or sooner if needed. --Continue follow-up with your primary care physician regarding the management of your other chronic comorbid conditions.  Patient's questions and concerns were addressed to his satisfaction. He voices understanding of the instructions provided during this encounter.   This note was created using a voice recognition software as a result there may be grammatical errors inadvertently enclosed that do not reflect the nature of this encounter. Every attempt is made to correct such errors.  Tessa Lerner, [place], [facility]  Pager: [phone] Office: [phone]

## 2022-03-05 ENCOUNTER — Ambulatory Visit: Payer: Medicare Other

## 2022-03-05 DIAGNOSIS — I4729 Other ventricular tachycardia: Secondary | ICD-10-CM

## 2022-03-05 DIAGNOSIS — I493 Ventricular premature depolarization: Secondary | ICD-10-CM

## 2022-03-12 NOTE — Progress Notes (Signed)
Called pt, he is aware of results.  

## 2022-06-09 ENCOUNTER — Other Ambulatory Visit: Payer: Self-pay | Admitting: Cardiology

## 2022-06-09 DIAGNOSIS — I4729 Other ventricular tachycardia: Secondary | ICD-10-CM

## 2022-06-09 DIAGNOSIS — I493 Ventricular premature depolarization: Secondary | ICD-10-CM

## 2022-07-11 ENCOUNTER — Ambulatory Visit (HOSPITAL_COMMUNITY)
Admission: RE | Admit: 2022-07-11 | Discharge: 2022-07-11 | Disposition: A | Discharge status: Home / Self Care | Payer: Medicare Other | Source: Ambulatory Visit | Attending: Cardiology | Admitting: Cardiology

## 2022-07-11 DIAGNOSIS — I351 Nonrheumatic aortic (valve) insufficiency: Secondary | ICD-10-CM | POA: Diagnosis present

## 2022-07-11 NOTE — Progress Notes (Signed)
  Echocardiogram 2D Echocardiogram has been performed.  [name] 07/11/2022, 11:36 AM

## 2022-07-13 LAB — ECHOCARDIOGRAM COMPLETE
AR max vel: 1.62 cm2
AV Area VTI: 2.17 cm2
AV Area mean vel: 1.8 cm2
AV Mean grad: 6 mmHg
AV Peak grad: 11.6 mmHg
Ao pk vel: 1.7 m/s
Area-P 1/2: 2.5 cm2
Calc EF: 57.4 %
MV VTI: 1.77 cm2
P 1/2 time: 596 msec
S' Lateral: 2.8 cm
Single Plane A2C EF: 58.2 %
Single Plane A4C EF: 56.9 %

## 2022-07-18 NOTE — Progress Notes (Signed)
Called patient, [name], [name]

## 2022-07-18 NOTE — Progress Notes (Signed)
Patient aware.

## 2022-08-06 ENCOUNTER — Ambulatory Visit: Payer: Medicare Other | Admitting: Cardiology

## 2022-08-06 ENCOUNTER — Encounter: Payer: Self-pay | Admitting: Cardiology

## 2022-08-06 VITALS — BP 117/46 | HR 81 | Resp 18 | Ht 68.0 in | Wt 185.4 lb

## 2022-08-06 DIAGNOSIS — I4729 Other ventricular tachycardia: Secondary | ICD-10-CM

## 2022-08-06 DIAGNOSIS — E119 Type 2 diabetes mellitus without complications: Secondary | ICD-10-CM

## 2022-08-06 DIAGNOSIS — I351 Nonrheumatic aortic (valve) insufficiency: Secondary | ICD-10-CM

## 2022-08-06 DIAGNOSIS — I10 Essential (primary) hypertension: Secondary | ICD-10-CM

## 2022-08-06 DIAGNOSIS — I493 Ventricular premature depolarization: Secondary | ICD-10-CM

## 2022-08-06 MED ORDER — METOPROLOL TARTRATE 25 MG PO TABS: 25.0000 mg | ORAL_TABLET | Freq: Three times a day (TID) | ORAL | 2 refills | Status: DC

## 2022-08-06 NOTE — Progress Notes (Signed)
[name] Date of Birth: 1931-04-22 MRN: [id number] Primary Care Provider:Paterson, Barry Dienes, MD Former Cardiology Providers: Altamese Bellewood, APRN, FNP-C Primary Cardiologist: Tessa Lerner, DO, [facility] (established care 05/03/2020)  Date: 08/06/22 Last Office Visit: 02/05/2022  Chief Complaint  Patient presents with   Follow-up    6 month- aortic regurgitation & PVC    HPI  [name] is a 88 y.o.  male whose past medical history and cardiovascular risk factors include: PVCs, Diet-controlled type II diabetes mellitus, hyperlipidemia, hypertension, aortic regurgitation, history of syncope, former smoker, advanced age.  Patient is being followed by the practice for management of aortic regurgitation and premature ventricular contractions.  In the past he was noted to have at least moderate aortic regurgitation and also an echodensity on the tricuspid valve.  He was recommended to undergo TEE to further evaluate the tricuspid valve but he wanted to avoid invasive procedures as he is the only caregiver for his son.  Given his PVCs his dose of Toprol-XL was increased to 100 mg p.o. daily.  However, he did not tolerate this dose as he felt tired and fatigued.  He is currently on 50 mg p.o. daily overall his PVCs are well-controlled but does have episodes of palpitations likely secondary to breakthrough PVCs.  His last cardiac monitor noted a PVC burden of 10.7% and NSVT.  Since last office visit he had a stress test which was overall low risk and echocardiogram notes preserved LVEF with some improvement in the severity of aortic regurgitation to mild/moderate.  Clinically patient denies anginal discomfort or heart failure symptoms.  Besides occasional palpitations he has not experienced any near-syncope or syncopal events.  FUNCTIONAL STATUS: He is active for age (manages 2 acer and dose yardwork).    ALLERGIES: Allergies  Allergen Reactions   Atorvastatin     Other reaction(s): myalgias      MEDICATION LIST PRIOR TO VISIT: Current Outpatient Medications on File Prior to Visit  Medication Sig Dispense Refill   acetaminophen (TYLENOL) 500 MG tablet Take 500 mg by mouth daily as needed.     amLODipine (NORVASC) 5 MG tablet TAKE 1 TABLET DAILY 90 tablet 3   aspirin EC 81 MG tablet Take 81 mg by mouth daily.     losartan (COZAAR) 50 MG tablet TAKE 1 TABLET DAILY 90 tablet 3   metFORMIN (GLUCOPHAGE-XR) 500 MG 24 hr tablet Take 500 mg by mouth daily.     nitroGLYCERIN (NITROSTAT) 0.4 MG SL tablet PLACE 1 TABLET UNDER THE TONGUE AS NEEDED FOR CHEST PAIN  1   triamterene-hydrochlorothiazide (MAXZIDE-25) 37.5-25 MG tablet Take 1 tablet by mouth daily.      Vitamin D, Ergocalciferol, (DRISDOL) 1.25 MG (50000 UNIT) CAPS capsule Take 50,000 Units by mouth every 7 (seven) days.     VYTORIN 10-40 MG tablet Take 0.5 tablets by mouth daily.      No current facility-administered medications on file prior to visit.    PAST MEDICAL HISTORY: Past Medical History:  Diagnosis Date   Aortic regurgitation    HOH (hard of hearing)    Hypertension    Premature ventricular contractions     PAST SURGICAL HISTORY: Past Surgical History:  Procedure Laterality Date   ESOPHAGOGASTRODUODENOSCOPY N/A 11/30/2015   Procedure: ESOPHAGOGASTRODUODENOSCOPY (EGD);  Surgeon: Dorena Cookey, MD;  Location: [facility];  Service: Endoscopy;  Laterality: N/A;   NO PAST SURGERIES      FAMILY HISTORY: No family history of premature coronary disease or sudden cardiac death.  SOCIAL HISTORY:  The patient  reports that he quit smoking about 28 years ago. His smoking use included cigarettes and pipe. He has a 12.50 pack-year smoking history. He quit smokeless tobacco use about 26 years ago. He reports that he does not drink alcohol and does not use drugs.  Review of Systems  HENT:         Hard of hearing  Cardiovascular:  Negative for chest pain, dyspnea on exertion, leg swelling, near-syncope, orthopnea,  palpitations, paroxysmal nocturnal dyspnea and syncope.    PHYSICAL EXAM:    08/06/2022    1:47 PM 02/05/2022   11:40 AM 12/22/2021   11:31 AM  Vitals with BMI  Height 5\' 8"  5\' 8"  5\' 8"   Weight 185 lbs 6 oz 186 lbs 188 lbs  BMI 28.2 28.29 28.59  Systolic 117 128  Diastolic 46 53 83  Pulse 81 60 63   Physical Exam  Constitutional: No distress.  Appears older than stated age, hemodynamically stable.   HENT:  Hearing aids in place  Neck: No JVD present.  Cardiovascular: Normal rate, regular rhythm, S1 normal, S2 normal, intact distal pulses and normal pulses. Occasional extrasystoles are present. Exam reveals no gallop, no S3 and no S4.  Murmur heard. Blowing decrescendo early diastolic murmur is present with a grade of 3/4 at the upper right sternal border. Pulmonary/Chest: Effort normal and breath sounds normal. No stridor. He has no wheezes. He has no rales.  Abdominal: Soft. Bowel sounds are normal. He exhibits no distension. There is no abdominal tenderness.  Musculoskeletal:        General: No edema.     Cervical back: Neck supple.  Neurological: He is alert and oriented to person, place, and time. He has intact cranial nerves (2-12).  Skin: Skin is warm and moist.   CARDIAC DATABASE: EKG: 08/06/2022: Sinus rhythm, 64 bpm, right bundle branch block, frequent PVCs, no significant change compared to prior ECG dated 02/05/2022.  Echocardiogram: 07/11/2022: 1. Left ventricular ejection fraction, by estimation, is 55 to 60%. The left ventricle has normal function. The left ventricle has no regional wall motion abnormalities. Left ventricular diastolic parameters were normal. 2. Right ventricular systolic function is normal. The right ventricular size is normal. There is normal pulmonary artery systolic pressure. The estimated right ventricular systolic pressure is 17.0 mmHg. 3. The mitral valve is normal in structure. Trivial mitral valve regurgitation. 4. Mild calcification  of the TV apparatus. 5. The aortic valve is tricuspid. Aortic valve regurgitation is mild to moderate. Aortic valve sclerosis/calcification is present, without any evidence of aortic stenosis. Aortic valve mean gradient measures 6.0 mmHg.  Comparison(s): Prior images reviewed side by side. Compared to 06/12/2021, no significant change. The echo density is not noted on the TV as previously noted and suspect mild reverberation artiract from TV apparatus mild calcifiation.   Stress Testing:  Lexiscan Tetrofosmin stress test 03/05/2022: Lexiscan nuclear stress test performed using *-day protocol. Decreased tracer uptake in inferior myocardium seen on short axis rest and stress images likely due to subdiaphragmatic attenuation. No definite evidence of ischemia.  Stress LVEF 52%. Low risk study.   Heart Catheterization: None  Carotid duplex: 06/24/2017: No hemodynamically significant arterial disease in the internal carotid artery bilaterally. Mild heteregenous plaque. Antegrade right vertebral artery flow. Antegrade left vertebral artery flow.  Ambulatory cardiac telemetry 11/08/2021 - 11/22/2021 (14 days): Predominant underlying rhythm was sinus with 3 asymptomatic episodes of ventricular tachycardia with the longest lasting 10 beats at a maximum  rate of 190 bpm.  18 episodes of supraventricular tachycardia versus atrial tachycardia with the longest lasting 16 beats.  Rare PACs, frequent PVCs, ventricular bigeminy and trigeminy were present.  Patient symptoms correlated with sinus rhythm, PVCs, and ventricular bigeminy.   LABORATORY DATA:  HDL 47 MG/DL 09/04/4172 LDL 08.144 mg 05/01/2019 Cholesterol, total 142.000 m 05/01/2019 Triglycerides 85.000 05/01/2019 A1C 6.800 % 05/01/2019 Glucose Random N 05/08/2019 MicroAlbumin Urine 6.000 05/08/2019 MicroAlbumin/Creat 7.6 MG/DL 04/13/5630 BUN 49.702 mg 05/01/2019 Creatinine, Serum 0.900 mg/ 05/01/2019  IMPRESSION:    ICD-10-CM   1. Nonrheumatic aortic  valve insufficiency  I35.1 EKG 12-Lead    2. Premature ventricular contraction  I49.3 metoprolol tartrate (LOPRESSOR) 25 MG tablet    3. NSVT (nonsustained ventricular tachycardia) (HCC)  I47.29 metoprolol tartrate (LOPRESSOR) 25 MG tablet    4. Essential hypertension  I10     5. Diet-controlled diabetes mellitus (HCC)  E11.9        RECOMMENDATIONS: FERRON ISHMAEL is a 88 y.o. male whose past medical history and cardiovascular risk factors include: PVCs, Diet-controlled type II diabetes mellitus, hyperlipidemia, hypertension, moderate aortic regurgitation, previous episodes of syncope felt to be secondary to medication, former smoker, advanced age.  Premature ventricular contraction: Stable-at times symptomatic. Last cardiac monitor noted a PVC burden approximately 10%. Currently on Toprol-XL 50 mg p.o. daily. In the past did not tolerate undergoing grams p.o. daily. Given the fact that he is having breakthrough episodes on the current dose of beta-blocker we will transition him to Lopressor 25 mg p.o. 3 times daily.  30-day supply provided.  If patient does well he will call for refills.  If he has any trouble with Lopressor we will transition him back to Toprol-XL. Will continue to monitor.  NSVT: Asymptomatic. Incidentally noted on cardiac monitor. MPI: Low risk study Continue beta-blocker therapy.  Monitor for now  Aortic regurgitation Asymptomatic. No significant pulse pressure difference. On prior studies severity of AR noted to be moderate, most recent echocardiogram notes AR to be mild/moderate without dilatation of the LV cavity.  Essential hypertension Office blood pressures are well controlled. Medications reconciled.  Controlled type 2 diabetes mellitus without complication, without long-term current use of insulin (HCC) Reemphasized the importance of glycemic control. Currently managed by primary care provider.   FINAL MEDICATION LIST END OF ENCOUNTER: Meds  ordered this encounter  Medications   metoprolol tartrate (LOPRESSOR) 25 MG tablet    Sig: Take 1 tablet (25 mg total) by mouth in the morning, at noon, and at bedtime.    Dispense:  90 tablet    Refill:  2    Medications Discontinued During This Encounter  Medication Reason   metoprolol succinate (TOPROL-XL) 50 MG 24 hr tablet Change in therapy      Current Outpatient Medications:    acetaminophen (TYLENOL) 500 MG tablet, Take 500 mg by mouth daily as needed., Disp: , Rfl:    amLODipine (NORVASC) 5 MG tablet, TAKE 1 TABLET DAILY, Disp: 90 tablet, Rfl: 3   aspirin EC 81 MG tablet, Take 81 mg by mouth daily., Disp: , Rfl:    losartan (COZAAR) 50 MG tablet, TAKE 1 TABLET DAILY, Disp: 90 tablet, Rfl: 3   metFORMIN (GLUCOPHAGE-XR) 500 MG 24 hr tablet, Take 500 mg by mouth daily., Disp: , Rfl:    metoprolol tartrate (LOPRESSOR) 25 MG tablet, Take 1 tablet (25 mg total) by mouth in the morning, at noon, and at bedtime., Disp: 90 tablet, Rfl: 2   nitroGLYCERIN (NITROSTAT) 0.4 MG  SL tablet, PLACE 1 TABLET UNDER THE TONGUE AS NEEDED FOR CHEST PAIN, Disp: , Rfl: 1   triamterene-hydrochlorothiazide (MAXZIDE-25) 37.5-25 MG tablet, Take 1 tablet by mouth daily. , Disp: , Rfl:    Vitamin D, Ergocalciferol, (DRISDOL) 1.25 MG (50000 UNIT) CAPS capsule, Take 50,000 Units by mouth every 7 (seven) days., Disp: , Rfl:    VYTORIN 10-40 MG tablet, Take 0.5 tablets by mouth daily. , Disp: , Rfl:   Orders Placed This Encounter  Procedures   EKG 12-Lead   --Continue cardiac medications as reconciled in final medication list. --Return in about 6 months (around 02/05/2023) for Follow up PVC and AR. Or sooner if needed. --Continue follow-up with your primary care physician regarding the management of your other chronic comorbid conditions.  Patient's questions and concerns were addressed to his satisfaction. He voices understanding of the instructions provided during this encounter.   This note was created  using a voice recognition software as a result there may be grammatical errors inadvertently enclosed that do not reflect the nature of this encounter. Every attempt is made to correct such errors.  Tessa LernerSunit Laylah Riga, [place]DO, [facility]  Pager: [phone] Office: [phone]

## 2022-09-01 ENCOUNTER — Other Ambulatory Visit: Payer: Self-pay | Admitting: Cardiology

## 2022-09-01 DIAGNOSIS — I4729 Other ventricular tachycardia: Secondary | ICD-10-CM

## 2022-09-01 DIAGNOSIS — I493 Ventricular premature depolarization: Secondary | ICD-10-CM

## 2022-09-08 ENCOUNTER — Other Ambulatory Visit: Payer: Self-pay | Admitting: Cardiology

## 2022-09-15 ENCOUNTER — Other Ambulatory Visit: Payer: Self-pay | Admitting: Cardiology

## 2022-10-22 ENCOUNTER — Other Ambulatory Visit: Payer: Self-pay | Admitting: Cardiology

## 2022-10-22 ENCOUNTER — Other Ambulatory Visit: Payer: Self-pay

## 2022-10-22 DIAGNOSIS — I493 Ventricular premature depolarization: Secondary | ICD-10-CM

## 2022-10-22 DIAGNOSIS — I4729 Other ventricular tachycardia: Secondary | ICD-10-CM

## 2022-10-22 MED ORDER — METOPROLOL TARTRATE 25 MG PO TABS: ORAL_TABLET | ORAL | 3 refills | Status: DC

## 2023-02-08 ENCOUNTER — Ambulatory Visit: Payer: Medicare Other | Admitting: Cardiology

## 2023-02-15 ENCOUNTER — Encounter: Payer: Self-pay | Admitting: Cardiology

## 2023-02-15 ENCOUNTER — Ambulatory Visit: Payer: Medicare Other | Admitting: Cardiology

## 2023-02-15 VITALS — BP 127/70 | HR 66 | Resp 16 | Ht 68.0 in | Wt 181.8 lb

## 2023-02-15 DIAGNOSIS — I10 Essential (primary) hypertension: Secondary | ICD-10-CM

## 2023-02-15 DIAGNOSIS — E119 Type 2 diabetes mellitus without complications: Secondary | ICD-10-CM

## 2023-02-15 DIAGNOSIS — I351 Nonrheumatic aortic (valve) insufficiency: Secondary | ICD-10-CM

## 2023-02-15 DIAGNOSIS — I493 Ventricular premature depolarization: Secondary | ICD-10-CM

## 2023-02-15 DIAGNOSIS — I4729 Other ventricular tachycardia: Secondary | ICD-10-CM

## 2023-02-15 NOTE — Progress Notes (Signed)
[name] Date of Birth: 1930/09/03 MRN: [id number] Primary Care Provider:Paterson, Barry Dienes, MD Former Cardiology Providers: Altamese McGregor, APRN, FNP-C Primary Cardiologist: Tessa Lerner, DO, [facility] (established care 05/03/2020)  Date: 02/15/23 Last Office Visit: 08/06/2022  Chief Complaint  Patient presents with   Nonrheumatic aortic valve insufficiency   Premature ventricular contraction   Follow-up    HPI  [name] is a 89 y.o.  male whose past medical history and cardiovascular risk factors include: PVCs, Diet-controlled type II diabetes mellitus, hyperlipidemia, hypertension, aortic regurgitation, history of syncope, former smoker, advanced age.  Patient is being followed by the practice for aortic regurgitation and premature ventricular contractions.  In the past the severity of aortic regurgitation was documented to be moderate and the most recent echocardiogram notes mild to moderate in severity.  Transthoracic echo in the past has also noted an echodensity on the tricuspid valve and he was recommended to undergo TEE for further evaluation.  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  Repeat echo in November 2023 did not illustrate the echodensity on the tricuspid valve and therefore felt it was likely secondary to reverberation artifact.  No additional workup is warranted.  With regards to PVC he was on Toprol-XL 100 mg p.o. daily but felt tired and fatigue.  Since repeat cardiac monitor illustrated a PVC burden of 10% he was transitioned to Lopressor 25 mg p.o. 3 times daily.  He presents today for 15-month follow-up visit.  Denies anginal chest pain or heart failure symptoms.  But feels tired and fatigue and reduced physical endurance.  Unable to reconcile medications accurately.  FUNCTIONAL STATUS: He is active for age (manages 2 acer and dose yardwork).    ALLERGIES: Allergies  Allergen Reactions   Atorvastatin      Other reaction(s): myalgias     MEDICATION LIST PRIOR TO VISIT: Current Outpatient Medications on File Prior to Visit  Medication Sig Dispense Refill   acetaminophen (TYLENOL) 500 MG tablet Take 500 mg by mouth daily as needed.     amLODipine (NORVASC) 5 MG tablet TAKE 1 TABLET DAILY 90 tablet 3   aspirin EC 81 MG tablet Take 81 mg by mouth daily.     losartan (COZAAR) 50 MG tablet TAKE 1 TABLET DAILY 90 tablet 3   metFORMIN (GLUCOPHAGE-XR) 500 MG 24 hr tablet Take 500 mg by mouth daily.     metoprolol tartrate (LOPRESSOR) 25 MG tablet TAKE 1 TABLET EVERY MORNING, AT NOON, AND AT BEDTIME 270 tablet 3   nitroGLYCERIN (NITROSTAT) 0.4 MG SL tablet PLACE 1 TABLET UNDER THE TONGUE AS NEEDED FOR CHEST PAIN  1   triamterene-hydrochlorothiazide (MAXZIDE-25) 37.5-25 MG tablet Take 1 tablet by mouth daily.      Vitamin D, Ergocalciferol, (DRISDOL) 1.25 MG (50000 UNIT) CAPS capsule Take 50,000 Units by mouth every 7 (seven) days.     VYTORIN 10-40 MG tablet Take 0.5 tablets by mouth daily.      No current facility-administered medications on file prior to visit.    PAST MEDICAL HISTORY: Past Medical History:  Diagnosis Date   Aortic regurgitation    HOH (hard of hearing)    Hypertension    Premature ventricular contractions     PAST SURGICAL HISTORY: Past Surgical History:  Procedure Laterality Date   ESOPHAGOGASTRODUODENOSCOPY N/A 11/30/2015   Procedure: ESOPHAGOGASTRODUODENOSCOPY (EGD);  Surgeon: Dorena Cookey, MD;  Location: [facility];  Service: Endoscopy;  Laterality: N/A;  NO PAST SURGERIES      FAMILY HISTORY: No family history of premature coronary disease or sudden cardiac death.   SOCIAL HISTORY:  The patient  reports that he quit smoking about 28 years ago. His smoking use included cigarettes and pipe. He has a 12.50 pack-year smoking history. He quit smokeless tobacco use about 27 years ago. He reports that he does not drink alcohol and does not use drugs.  Review of  Systems  Constitutional: Positive for malaise/fatigue.  HENT:         Hard of hearing  Cardiovascular:  Negative for chest pain, dyspnea on exertion, leg swelling, near-syncope, orthopnea, palpitations, paroxysmal nocturnal dyspnea and syncope.    PHYSICAL EXAM:    02/15/2023   10:56 AM 08/06/2022    1:47 PM 02/05/2022   11:40 AM  Vitals with BMI  Height 5\' 8"  5\' 8"  5\' 8"   Weight 181 lbs 13 oz 185 lbs 6 oz 186 lbs  BMI 27.65 28.2 28.29  Systolic 127 117 629  Diastolic 70 46 53  Pulse 66 81 60   Physical Exam  Constitutional: No distress.  Appears older than stated age, hemodynamically stable.   HENT:  Hearing aids in place  Neck: No JVD present.  Cardiovascular: Normal rate, regular rhythm, S1 normal, S2 normal, intact distal pulses and normal pulses. Occasional extrasystoles are present. Exam reveals no gallop, no S3 and no S4.  Murmur heard. Blowing decrescendo early diastolic murmur is present with a grade of 3/4 at the upper right sternal border. Pulmonary/Chest: Effort normal and breath sounds normal. No stridor. He has no wheezes. He has no rales.  Abdominal: Soft. Bowel sounds are normal. He exhibits no distension. There is no abdominal tenderness.  Musculoskeletal:        General: No edema.     Cervical back: Neck supple.  Neurological: He is alert and oriented to person, place, and time. He has intact cranial nerves (2-12).  Skin: Skin is warm and moist.   CARDIAC DATABASE: EKG: February 15, 2023: Sinus bradycardia, 57 bpm, occasional PACs, RBBB.  Compared to prior EKG 08/06/2022 frequent PVCs are not appreciated.  Echocardiogram: 07/11/2022: 1. Left ventricular ejection fraction, by estimation, is 55 to 60%. The left ventricle has normal function. The left ventricle has no regional wall motion abnormalities. Left ventricular diastolic parameters were normal. 2. Right ventricular systolic function is normal. The right ventricular size is normal. There is normal  pulmonary artery systolic pressure. The estimated right ventricular systolic pressure is 17.0 mmHg. 3. The mitral valve is normal in structure. Trivial mitral valve regurgitation. 4. Mild calcification of the TV apparatus. 5. The aortic valve is tricuspid. Aortic valve regurgitation is mild to moderate. Aortic valve sclerosis/calcification is present, without any evidence of aortic stenosis. Aortic valve mean gradient measures 6.0 mmHg.  Comparison(s): Prior images reviewed side by side. Compared to 06/12/2021, no significant change. The echo density is not noted on the TV as previously noted and suspect mild reverberation artiract from TV apparatus mild calcifiation.   Stress Testing:  Lexiscan Tetrofosmin stress test 03/05/2022: Lexiscan nuclear stress test performed using *-day protocol. Decreased tracer uptake in inferior myocardium seen on short axis rest and stress images likely due to subdiaphragmatic attenuation. No definite evidence of ischemia.  Stress LVEF 52%. Low risk study.   Heart Catheterization: None  Carotid duplex: 06/24/2017: No hemodynamically significant arterial disease in the internal carotid artery bilaterally. Mild heteregenous plaque. Antegrade right vertebral artery flow. Antegrade left vertebral artery  flow.  Ambulatory cardiac telemetry 11/08/2021 - 11/22/2021 (14 days): Predominant underlying rhythm was sinus with 3 asymptomatic episodes of ventricular tachycardia with the longest lasting 10 beats at a maximum rate of 190 bpm.  18 episodes of supraventricular tachycardia versus atrial tachycardia with the longest lasting 16 beats.  Rare PACs, frequent PVCs, ventricular bigeminy and trigeminy were present.  Patient symptoms correlated with sinus rhythm, PVCs, and ventricular bigeminy.   LABORATORY DATA:  HDL 47 MG/DL 08/31/7827 LDL 56.213 mg 05/01/2019 Cholesterol, total 142.000 m 05/01/2019 Triglycerides 85.000 05/01/2019 A1C 6.800 % 05/01/2019 Glucose Random N  05/08/2019 MicroAlbumin Urine 6.000 05/08/2019 MicroAlbumin/Creat 7.6 MG/DL 0/86/5784 BUN 69.629 mg 05/01/2019 Creatinine, Serum 0.900 mg/ 05/01/2019  IMPRESSION:    ICD-10-CM   1. Nonrheumatic aortic valve insufficiency  I35.1 EKG 12-Lead    PCV ECHOCARDIOGRAM COMPLETE    Basic metabolic panel    Magnesium    Pro b natriuretic peptide (BNP)    2. Premature ventricular contraction  I49.3 LONG TERM MONITOR (3-14 DAYS)    3. NSVT (nonsustained ventricular tachycardia) (HCC)  I47.29     4. Essential hypertension  I10     5. Diet-controlled diabetes mellitus (HCC)  E11.9        RECOMMENDATIONS: [name] is a 89 y.o. male whose past medical history and cardiovascular risk factors include: PVCs, Diet-controlled type II diabetes mellitus, hyperlipidemia, hypertension, moderate aortic regurgitation, previous episodes of syncope felt to be secondary to medication, former smoker, advanced age.  Premature ventricular contraction: Prior cardiac monitor noted PVC burden of approximately 10%. EKG today shows sinus rhythm without PVCs and occasional PACs. Due to reduced functional capacity/endurance recommend a repeat 3-day monitor to reevaluate ectopic burden and/or dysrhythmias Continue current medical therapy.  NSVT: Asymptomatic. Incidentally noted on cardiac monitor. MPI: Low risk study Currently on medical therapy.  Aortic regurgitation Given the fact that he has been experiencing reduced physical endurance would like to repeat echocardiogram to reevaluate the progression of aortic regurgitation and LV dimensions Echo will be ordered to evaluate for structural heart disease and left ventricular systolic function.  Essential hypertension Office blood pressures are very well-controlled. Medications reviewed. No changes warranted at this time.  At the follow-up I have requested him to bring all his medication bottles so accurate medication reconciliation can be done.  He is not  aware of what medications he is regularly taking.  Also will recheck electrolytes and renal function given his symptoms.  Otherwise workup as noted above.  FINAL MEDICATION LIST END OF ENCOUNTER: No orders of the defined types were placed in this encounter.   There are no discontinued medications.     Current Outpatient Medications:    acetaminophen (TYLENOL) 500 MG tablet, Take 500 mg by mouth daily as needed., Disp: , Rfl:    amLODipine (NORVASC) 5 MG tablet, TAKE 1 TABLET DAILY, Disp: 90 tablet, Rfl: 3   aspirin EC 81 MG tablet, Take 81 mg by mouth daily., Disp: , Rfl:    losartan (COZAAR) 50 MG tablet, TAKE 1 TABLET DAILY, Disp: 90 tablet, Rfl: 3   metFORMIN (GLUCOPHAGE-XR) 500 MG 24 hr tablet, Take 500 mg by mouth daily., Disp: , Rfl:    metoprolol tartrate (LOPRESSOR) 25 MG tablet, TAKE 1 TABLET EVERY MORNING, AT NOON, AND AT BEDTIME, Disp: 270 tablet, Rfl: 3   nitroGLYCERIN (NITROSTAT) 0.4 MG SL tablet, PLACE 1 TABLET UNDER THE TONGUE AS NEEDED FOR CHEST PAIN, Disp: , Rfl: 1   triamterene-hydrochlorothiazide (MAXZIDE-25) 37.5-25 MG tablet, Take  1 tablet by mouth daily. , Disp: , Rfl:    Vitamin D, Ergocalciferol, (DRISDOL) 1.25 MG (50000 UNIT) CAPS capsule, Take 50,000 Units by mouth every 7 (seven) days., Disp: , Rfl:    VYTORIN 10-40 MG tablet, Take 0.5 tablets by mouth daily. , Disp: , Rfl:   Orders Placed This Encounter  Procedures   Basic metabolic panel   Magnesium   Pro b natriuretic peptide (BNP)   LONG TERM MONITOR (3-14 DAYS)   EKG 12-Lead   PCV ECHOCARDIOGRAM COMPLETE   --Continue cardiac medications as reconciled in final medication list. --Return in about 5 weeks (around 03/22/2023) for Follow up reduced functional capacity/endurance, review echo and monitor results.. Or sooner if needed. --Continue follow-up with your primary care physician regarding the management of your other chronic comorbid conditions.  Patient's questions and concerns were addressed to his  satisfaction. He voices understanding of the instructions provided during this encounter.   This note was created using a voice recognition software as a result there may be grammatical errors inadvertently enclosed that do not reflect the nature of this encounter. Every attempt is made to correct such errors.  Tessa Lerner, [place], [facility]  Pager:  [phone] Office: [phone]

## 2023-02-19 ENCOUNTER — Other Ambulatory Visit: Payer: Self-pay

## 2023-02-19 DIAGNOSIS — I351 Nonrheumatic aortic (valve) insufficiency: Secondary | ICD-10-CM

## 2023-03-13 ENCOUNTER — Ambulatory Visit: Payer: Medicare Other

## 2023-03-13 DIAGNOSIS — I493 Ventricular premature depolarization: Secondary | ICD-10-CM

## 2023-03-13 DIAGNOSIS — I351 Nonrheumatic aortic (valve) insufficiency: Secondary | ICD-10-CM

## 2023-03-20 NOTE — Progress Notes (Signed)
Called patient no answer left a vm

## 2023-03-25 NOTE — Progress Notes (Signed)
Called patient no answer left a vm

## 2023-04-03 NOTE — Progress Notes (Signed)
Called pt left VM.

## 2023-04-08 ENCOUNTER — Encounter: Payer: Self-pay | Admitting: Cardiology

## 2023-04-08 ENCOUNTER — Ambulatory Visit: Payer: Medicare Other | Admitting: Cardiology

## 2023-04-08 VITALS — BP 142/72 | HR 55 | Resp 16 | Wt 180.4 lb

## 2023-04-08 DIAGNOSIS — I493 Ventricular premature depolarization: Secondary | ICD-10-CM

## 2023-04-08 DIAGNOSIS — I351 Nonrheumatic aortic (valve) insufficiency: Secondary | ICD-10-CM

## 2023-04-08 DIAGNOSIS — I4729 Other ventricular tachycardia: Secondary | ICD-10-CM

## 2023-04-08 DIAGNOSIS — I10 Essential (primary) hypertension: Secondary | ICD-10-CM

## 2023-04-08 NOTE — Progress Notes (Signed)
[name] Date of Birth: 02/24/1931 MRN: [id number] Primary Care Provider:Paterson, Barry Dienes, MD Former Cardiology Providers: Altamese Cannelton, APRN, FNP-C Primary Cardiologist: Tessa Lerner, DO, [facility] (established care 05/03/2020)  Date: 04/08/23 Last Office Visit: 02/15/2023  Chief Complaint  Patient presents with   Follow-up    Increased PVC burden, review test results    HPI  [name] is a 89 y.o.  male whose past medical history and cardiovascular risk factors include: PVCs, Diet-controlled type II diabetes mellitus, hyperlipidemia, hypertension, aortic regurgitation, history of syncope, former smoker, advanced age.  Patient is being followed by the practice for aortic regurgitation and premature ventricular contractions.  In the past the severity of aortic regurgitation was documented to be moderate and the most recent echocardiogram notes mild to moderate in severity.  Transthoracic echo in the past has also noted an echodensity on the tricuspid valve and he was recommended to undergo TEE for further evaluation.  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  Repeat echo in November 2023 did not illustrate the echodensity on the tricuspid valve and therefore felt it was likely secondary to reverberation artifact.  No additional workup is warranted.  High PVC burden: In the past patient was noted to have a PVC burden of approximately 10%.  He was started on Toprol-XL and uptitrated the dose to 100 mg p.o. daily.  Due to feeling tired and fatigue he was transitioned to Lopressor 25 mg p.o. 3 times daily.  Since last office visit he had a repeat Zio patch which noted an average heart rate of 56 bpm, and total PVC burden has come down to 3.3%.  Most recent echocardiogram also illustrates preserved LVEF, normal diastolic function, mild to moderate AI, see report for additional details.  Clinically, feels much better.  Denies feeling tired,  fatigued, chest pain, heart failure symptoms.  No near-syncope or syncopal events.  Again he forgot to bring a medication list or bottles to accurately reconcile his home medications.   FUNCTIONAL STATUS: He is active for age (manages 2 acer and dose yardwork).    ALLERGIES: Allergies  Allergen Reactions   Atorvastatin     Other reaction(s): myalgias     MEDICATION LIST PRIOR TO VISIT: Current Outpatient Medications on File Prior to Visit  Medication Sig Dispense Refill   acetaminophen (TYLENOL) 500 MG tablet Take 500 mg by mouth daily as needed.     amLODipine (NORVASC) 5 MG tablet TAKE 1 TABLET DAILY 90 tablet 3   aspirin EC 81 MG tablet Take 81 mg by mouth daily.     losartan (COZAAR) 50 MG tablet TAKE 1 TABLET DAILY 90 tablet 3   metFORMIN (GLUCOPHAGE-XR) 500 MG 24 hr tablet Take 500 mg by mouth daily.     metoprolol tartrate (LOPRESSOR) 25 MG tablet TAKE 1 TABLET EVERY MORNING, AT NOON, AND AT BEDTIME 270 tablet 3   nitroGLYCERIN (NITROSTAT) 0.4 MG SL tablet PLACE 1 TABLET UNDER THE TONGUE AS NEEDED FOR CHEST PAIN  1   triamterene-hydrochlorothiazide (MAXZIDE-25) 37.5-25 MG tablet Take 1 tablet by mouth daily.      Vitamin D, Ergocalciferol, (DRISDOL) 1.25 MG (50000 UNIT) CAPS capsule Take 50,000 Units by mouth every 7 (seven) days.     VYTORIN 10-40 MG tablet Take 0.5 tablets by mouth daily.      No current facility-administered medications on file prior to visit.    PAST MEDICAL HISTORY: Past Medical History:  Diagnosis Date   Aortic regurgitation    HOH (hard of hearing)    Hypertension    Premature ventricular contractions     PAST SURGICAL HISTORY: Past Surgical History:  Procedure Laterality Date   ESOPHAGOGASTRODUODENOSCOPY N/A 11/30/2015   Procedure: ESOPHAGOGASTRODUODENOSCOPY (EGD);  Surgeon: Dorena Cookey, MD;  Location: [facility];  Service: Endoscopy;  Laterality: N/A;   NO PAST SURGERIES      FAMILY HISTORY: No family history of premature coronary  disease or sudden cardiac death.   SOCIAL HISTORY:  The patient  reports that he quit smoking about 29 years ago. His smoking use included cigarettes and pipe. He started smoking about 79 years ago. He has a 12.5 pack-year smoking history. He quit smokeless tobacco use about 27 years ago. He reports that he does not drink alcohol and does not use drugs.  Review of Systems  Constitutional: Positive for malaise/fatigue.  HENT:         Hard of hearing  Cardiovascular:  Negative for chest pain, dyspnea on exertion, leg swelling, near-syncope, orthopnea, palpitations, paroxysmal nocturnal dyspnea and syncope.    PHYSICAL EXAM:    04/08/2023   10:25 AM 02/15/2023   10:56 AM 08/06/2022    1:47 PM  Vitals with BMI  Height  5\' 8"  5\' 8"   Weight 180 lbs 6 oz 181 lbs 13 oz 185 lbs 6 oz  BMI  27.65 28.2  Systolic 142 127 416  Diastolic 72 70 46  Pulse 55 66 81   Physical Exam  Constitutional: No distress.  Appears older than stated age, hemodynamically stable.   HENT:  Hearing aids in place  Neck: No JVD present.  Cardiovascular: Normal rate, regular rhythm, S1 normal, S2 normal, intact distal pulses and normal pulses. Occasional extrasystoles are present. Exam reveals no gallop, no S3 and no S4.  Murmur heard. Blowing decrescendo early diastolic murmur is present with a grade of 3/4 at the upper right sternal border. Pulmonary/Chest: Effort normal and breath sounds normal. No stridor. He has no wheezes. He has no rales.  Abdominal: Soft. Bowel sounds are normal. He exhibits no distension. There is no abdominal tenderness.  Musculoskeletal:        General: No edema.     Cervical back: Neck supple.  Neurological: He is alert and oriented to person, place, and time. He has intact cranial nerves (2-12).  Skin: Skin is warm and moist.   CARDIAC DATABASE: EKG: February 15, 2023: Sinus bradycardia, 57 bpm, occasional PACs, RBBB.  Compared to prior EKG 08/06/2022 frequent PVCs are not  appreciated.  Echocardiogram: 03/13/2023: Left ventricle cavity is normal in size. Moderate concentric hypertrophy of the left ventricle. Normal global wall motion. Normal LV systolic function with EF 62%. Normal diastolic filling pattern.  Trileaflet aortic valve. Mild aortic valve leaflet calcification. Mild to moderate aortic regurgitation. Mild calcification of the mitral valve annulus. Trace mitral regurgitation. Mild tricuspid regurgitation.  No evidence of pulmonary hypertension. No significant change compared to previous study on 03/29/2021.   Stress Testing:  Lexiscan Tetrofosmin stress test 03/05/2022: Lexiscan nuclear stress test performed using *-day protocol. Decreased tracer uptake in inferior myocardium seen on short axis rest and stress images likely due to subdiaphragmatic attenuation. No definite evidence of ischemia.  Stress LVEF 52%. Low risk study.   Heart Catheterization: None  Carotid duplex: 06/24/2017: No hemodynamically significant arterial disease in the internal carotid artery bilaterally. Mild heteregenous plaque. Antegrade right vertebral artery flow. Antegrade left vertebral artery flow.  Ambulatory cardiac  telemetry 11/08/2021 - 11/22/2021 (14 days): Predominant underlying rhythm was sinus with 3 asymptomatic episodes of ventricular tachycardia with the longest lasting 10 beats at a maximum rate of 190 bpm.  18 episodes of supraventricular tachycardia versus atrial tachycardia with the longest lasting 16 beats.  Rare PACs, frequent PVCs, ventricular bigeminy and trigeminy were present.  Patient symptoms correlated with sinus rhythm, PVCs, and ventricular bigeminy.  Cardiac monitor [facility] Patch): March 13, 2023 -March 16, 2023 Dominant rhythm sinus, followed by bradycardia (burden 20%). Heart rate 41-160 bpm. Avg HR 56 bpm. No atrial fibrillation, high grade AV block, pauses (3 seconds or longer). Total ventricular ectopic burden 3.3%. 1 episode of  asymptomatic NSVT, on 03/16/2023, at 3:34 AM, 4 beats, max heart rate 160 bpm, average heart rate 146 bpm. Total supraventricular ectopic burden <1%. Patient triggered events: 0.   LABORATORY DATA:  HDL 47 MG/DL 09/27/3084 LDL 57.846 mg 05/01/2019 Cholesterol, total 142.000 m 05/01/2019 Triglycerides 85.000 05/01/2019 A1C 6.800 % 05/01/2019 Glucose Random N 05/08/2019 MicroAlbumin Urine 6.000 05/08/2019 MicroAlbumin/Creat 7.6 MG/DL 9/62/9528 BUN 41.324 mg 05/01/2019 Creatinine, Serum 0.900 mg/ 05/01/2019  IMPRESSION:    ICD-10-CM   1. Premature ventricular contraction  I49.3     2. NSVT (nonsustained ventricular tachycardia) (HCC)  I47.29     3. Nonrheumatic aortic valve insufficiency  I35.1     4. Essential hypertension  I10         RECOMMENDATIONS: [name] is a 89 y.o. male whose past medical history and cardiovascular risk factors include: PVCs, Diet-controlled type II diabetes mellitus, hyperlipidemia, hypertension, moderate aortic regurgitation, previous episodes of syncope felt to be secondary to medication, former smoker, advanced age.  Premature ventricular contraction March 2023-PVC burden 10.7%. July 2024: PVC burden 3.3% Continue current dose of metoprolol.  Difficult to reconcile medications for reasons mentioned above. Echocardiogram notes preserved LVEF. Monitor for now  NSVT (nonsustained ventricular tachycardia) (HCC) Asymptomatic. Has undergone ischemic workup as outlined above. Continue AV nodal blocking agents. Monitor for now  Nonrheumatic aortic valve insufficiency Asymptomatic. Repeat echocardiogram in July 2024 notes that the severity of AR to be mild/moderate. Blood pressures are within acceptable range-he will continue to monitor at home to see if additional medication titration is warranted  Essential hypertension Office blood pressures are acceptable. Home blood pressures are better controlled. Continue current medical therapy. Monitor for  now. If he continues to have elevated readings could consider up titration of medical therapy either by our office or PCP.  FINAL MEDICATION LIST END OF ENCOUNTER: No orders of the defined types were placed in this encounter.   There are no discontinued medications.     Current Outpatient Medications:    acetaminophen (TYLENOL) 500 MG tablet, Take 500 mg by mouth daily as needed., Disp: , Rfl:    amLODipine (NORVASC) 5 MG tablet, TAKE 1 TABLET DAILY, Disp: 90 tablet, Rfl: 3   aspirin EC 81 MG tablet, Take 81 mg by mouth daily., Disp: , Rfl:    losartan (COZAAR) 50 MG tablet, TAKE 1 TABLET DAILY, Disp: 90 tablet, Rfl: 3   metFORMIN (GLUCOPHAGE-XR) 500 MG 24 hr tablet, Take 500 mg by mouth daily., Disp: , Rfl:    metoprolol tartrate (LOPRESSOR) 25 MG tablet, TAKE 1 TABLET EVERY MORNING, AT NOON, AND AT BEDTIME, Disp: 270 tablet, Rfl: 3   nitroGLYCERIN (NITROSTAT) 0.4 MG SL tablet, PLACE 1 TABLET UNDER THE TONGUE AS NEEDED FOR CHEST PAIN, Disp: , Rfl: 1   triamterene-hydrochlorothiazide (MAXZIDE-25) 37.5-25 MG tablet, Take 1  tablet by mouth daily. , Disp: , Rfl:    Vitamin D, Ergocalciferol, (DRISDOL) 1.25 MG (50000 UNIT) CAPS capsule, Take 50,000 Units by mouth every 7 (seven) days., Disp: , Rfl:    VYTORIN 10-40 MG tablet, Take 0.5 tablets by mouth daily. , Disp: , Rfl:   No orders of the defined types were placed in this encounter.  --Continue cardiac medications as reconciled in final medication list. --Return in about 6 months (around 10/09/2023) for Follow up PVCs. Or sooner if needed. --Continue follow-up with your primary care physician regarding the management of your other chronic comorbid conditions.  Patient's questions and concerns were addressed to his satisfaction. He voices understanding of the instructions provided during this encounter.   This note was created using a voice recognition software as a result there may be grammatical errors inadvertently enclosed that do not  reflect the nature of this encounter. Every attempt is made to correct such errors.  Tessa Lerner, [place], [facility]  Pager:  [phone] Office: [phone]

## 2023-04-08 NOTE — Progress Notes (Signed)
Called patient to inform him that Dr. Nancy Fetter will go over his monitor results. At his office visit

## 2023-07-06 ENCOUNTER — Other Ambulatory Visit: Payer: Self-pay | Admitting: Cardiology

## 2023-07-06 DIAGNOSIS — I4729 Other ventricular tachycardia: Secondary | ICD-10-CM

## 2023-07-06 DIAGNOSIS — I493 Ventricular premature depolarization: Secondary | ICD-10-CM

## 2023-07-27 ENCOUNTER — Other Ambulatory Visit: Payer: Self-pay | Admitting: Cardiology

## 2023-10-10 ENCOUNTER — Ambulatory Visit: Payer: Self-pay | Admitting: Cardiology

## 2023-11-04 ENCOUNTER — Ambulatory Visit: Payer: Medicare Other | Attending: Cardiology | Admitting: Cardiology

## 2023-11-04 ENCOUNTER — Encounter: Payer: Self-pay | Admitting: Cardiology

## 2023-11-04 VITALS — BP 112/62 | HR 62 | Ht 68.0 in | Wt 178.6 lb

## 2023-11-04 DIAGNOSIS — I493 Ventricular premature depolarization: Secondary | ICD-10-CM

## 2023-11-04 DIAGNOSIS — I4729 Other ventricular tachycardia: Secondary | ICD-10-CM

## 2023-11-04 DIAGNOSIS — I351 Nonrheumatic aortic (valve) insufficiency: Secondary | ICD-10-CM | POA: Diagnosis not present

## 2023-11-04 DIAGNOSIS — I10 Essential (primary) hypertension: Secondary | ICD-10-CM | POA: Diagnosis not present

## 2023-11-04 NOTE — Patient Instructions (Signed)
 Medication Instructions:  Your physician recommends that you continue on your current medications as directed. Please refer to the Current Medication list given to you today.  *If you need a refill on your cardiac medications before your next appointment, please call your pharmacy*  Lab Work: None ordered today. If you have labs (blood work) drawn today and your tests are completely normal, you will receive your results only by: MyChart Message (if you have MyChart) OR A paper copy in the mail If you have any lab test that is abnormal or we need to change your treatment, we will call you to review the results.  Testing/Procedures: None ordered today.  Follow-Up: At [facility], you and your health needs are our priority.  As part of our continuing mission to provide you with exceptional heart care, we have created designated Provider Care Teams.  These Care Teams include your primary Cardiologist (physician) and Advanced Practice Providers (APPs -  Physician Assistants and Nurse Practitioners) who all work together to provide you with the care you need, when you need it.  We recommend signing up for the patient portal called "MyChart".  Sign up information is provided on this After Visit Summary.  MyChart is used to connect with patients for Virtual Visits (Telemedicine).  Patients are able to view lab/test results, encounter notes, upcoming appointments, etc.  Non-urgent messages can be sent to your provider as well.   To learn more about what you can do with MyChart, go to [email or web address].    Your next appointment:   1 year(s)  The format for your next appointment:   In Person  Provider:   Tessa Lerner, DO {

## 2023-11-04 NOTE — Progress Notes (Unsigned)
 Cardiology Office Note:  .   Date:  11/04/2023  ID:  [name], DOB [birth year], MRN [id number] PCP:  Garlan Fillers, MD  Former Cardiology Providers: Altamese Lawn, APRN, FNP-C  [facility] Providers Cardiologist:  Tessa Lerner, DO , [facility] (established care 05/03/2020 ) Electrophysiologist:  None  Click to update primary MD,subspecialty MD or APP then REFRESH:1}    Chief Complaint  Patient presents with   Premature ventricular contraction   Nonrheumatic aortic valve insufficiency   Follow-up    6 months    History of Present Illness: .   [name] is a 89 y.o. Caucasian male whose past medical history and cardiovascular risk factors includes: PVCs, Diet-controlled type II diabetes mellitus, hyperlipidemia, hypertension, aortic regurgitation, history of syncope, former smoker, advanced age.   Patient being followed by the practice given his history of aortic regurgitation and premature ventricular contractions.  Aortic regurgitation: Prior echocardiograms have noted severity of AI to be mild to moderate.  Clinically he is asymptomatic.  PVC: Initially his PVC burden was approximately 10%.  He was started on Toprol-XL and the medication was uptitrated.  Due to feeling fatigued and tired his dose was reduced to Lopressor 25 mg p.o. 3 times daily.  He had a repeat Zio patch which notes a PVC burden of approximately 3.3%.  Patient presents today for 51-month follow-up study.  Patient states that he is doing well from a cardiovascular standpoint.  He denies anginal chest pain or heart failure symptoms.  Overall function capacity remains relatively stable.  He does not experience any palpitations, near-syncope or syncopal events..    Review of Systems: .   Review of Systems  Constitutional: Negative for malaise/fatigue.  HENT:         Hard of hearing  Cardiovascular:  Negative for chest pain, dyspnea on exertion, leg swelling, near-syncope, orthopnea, palpitations,  paroxysmal nocturnal dyspnea and syncope.    Studies Reviewed:   EKG: EKG Interpretation Date/Time:  Monday November 04 2023 10:57:15 EDT Ventricular Rate:  63 PR Interval:  180 QRS Duration:  132 QT Interval:  464 QTC Calculation: 474 R Axis:   52  Text Interpretation: Normal sinus rhythm Right bundle branch block Consider Septal infarct , age undetermined When compared with ECG of 30-Nov-2015 10:38, Septal infarct is now Present Confirmed by Tessa Lerner [id number]) on 11/04/2023 11:26:47 AM  Echocardiogram: 03/13/2023: Left ventricle cavity is normal in size. Moderate concentric hypertrophy of the left ventricle. Normal global wall motion. Normal LV systolic function with EF 62%. Normal diastolic filling pattern.  Trileaflet aortic valve. Mild aortic valve leaflet calcification. Mild to moderate aortic regurgitation. Mild calcification of the mitral valve annulus. Trace mitral regurgitation. Mild tricuspid regurgitation.  No evidence of pulmonary hypertension. No significant change compared to previous study on 03/29/2021.    Stress Testing:  Lexiscan Tetrofosmin stress test 03/05/2022: Lexiscan nuclear stress test performed using *-day protocol. Decreased tracer uptake in inferior myocardium seen on short axis rest and stress images likely due to subdiaphragmatic attenuation. No definite evidence of ischemia.  Stress LVEF 52%. Low risk study.   Ambulatory cardiac telemetry 11/08/2021 - 11/22/2021 (14 days): Predominant underlying rhythm was sinus with 3 asymptomatic episodes of ventricular tachycardia with the longest lasting 10 beats at a maximum rate of 190 bpm.  18 episodes of supraventricular tachycardia versus atrial tachycardia with the longest lasting 16 beats.  Rare PACs, frequent PVCs, ventricular bigeminy and trigeminy were present.  Patient symptoms correlated with sinus rhythm, PVCs,  and ventricular bigeminy.   Cardiac monitor [facility] Patch): March 13, 2023 -March 16, 2023 Dominant  rhythm sinus, followed by bradycardia (burden 20%). Heart rate 41-160 bpm. Avg HR 56 bpm. No atrial fibrillation, high grade AV block, pauses (3 seconds or longer). Total ventricular ectopic burden 3.3%. 1 episode of asymptomatic NSVT, on 03/16/2023, at 3:34 AM, 4 beats, max heart rate 160 bpm, average heart rate 146 bpm. Total supraventricular ectopic burden <1%.  RADIOLOGY: NA  Risk Assessment/Calculations:   NA   Labs:    External Labs: Collected: July 03, 2023 [facility] database. Total cholesterol 141, triglycerides 112, HDL 58, LDL 63, BUN 15, creatinine 0.9. Sodium 138, potassium 5.2, chloride 100, bicarb 24. AST, ALT, alkaline phosphatase within normal limits. Hemoglobin 15.8  Physical Exam:    Today's Vitals   11/04/23 1056  BP: 112/62  Pulse: 62  SpO2: 94%  Weight: 178 lb 9.6 oz (81 kg)  Height: 5\' 8"  (1.727 m)   Body mass index is 27.16 kg/m. Wt Readings from Last 3 Encounters:  11/04/23 178 lb 9.6 oz (81 kg)  04/08/23 180 lb 6.4 oz (81.8 kg)  02/15/23 181 lb 12.8 oz (82.5 kg)    Physical Exam  Constitutional: No distress.  Appears older than stated age, hemodynamically stable.   HENT:  Hearing aids in place  Neck: No JVD present.  Cardiovascular: Normal rate, regular rhythm, S1 normal, S2 normal, intact distal pulses and normal pulses. Exam reveals no gallop, no S3 and no S4.  Murmur heard. Blowing decrescendo early diastolic murmur is present with a grade of 3/4 at the upper right sternal border. Pulmonary/Chest: Effort normal and breath sounds normal. No stridor. He has no wheezes. He has no rales.  Abdominal: Soft. Bowel sounds are normal. He exhibits no distension. There is no abdominal tenderness.  Musculoskeletal:        General: No edema.     Cervical back: Neck supple.  Neurological: He is alert and oriented to person, place, and time. He has intact cranial nerves (2-12).  Skin: Skin is warm and moist.     Impression & Recommendation(s):   Impression:   ICD-10-CM   1. Premature ventricular contraction  I49.3 EKG 12-Lead    2. NSVT (nonsustained ventricular tachycardia) (HCC)  I47.29     3. Nonrheumatic aortic valve insufficiency  I35.1     4. Essential hypertension  I10        Recommendation(s):  Premature ventricular contraction NSVT (nonsustained ventricular tachycardia) (HCC) Currently asymptomatic. Continue metoprolol tartrate 25 mg p.o. 3 times daily. Patient has undergone appropriate ischemic workup in the past as discussed above. March 2023-PVC burden 10.7%. July 2024: PVC burden 3.3% EKG today illustrates sinus rhythm without ectopy. Continue current medical therapy.  Nonrheumatic aortic valve insufficiency Clinically asymptomatic. Severity of aortic regurgitation has improved on follow-up studies. Continue to monitor clinically for now. Office blood pressures are well-controlled.  Essential hypertension Continue amlodipine 5 mg p.o. daily. Continue losartan 50 p.o. daily Continue triamterene/hydrochlorothiazide 37.5/25 mg p.o. daily Office and home blood pressure well controlled. Reemphasized importance of low-salt diet. Cardiology following peripherally, managed by primary care provider.   Orders Placed:  Orders Placed This Encounter  Procedures   EKG 12-Lead    Today's office visit reviewed and ordered EKG, outside labs from 07/03/2023, prior echo and stress test results reviewed as part of medical decision making.  Final Medication List:   No orders of the defined types were placed in this encounter.   There are  no discontinued medications.   Current Outpatient Medications:    acetaminophen (TYLENOL) 500 MG tablet, Take 500 mg by mouth daily as needed., Disp: , Rfl:    amLODipine (NORVASC) 5 MG tablet, TAKE 1 TABLET DAILY, Disp: 90 tablet, Rfl: 2   aspirin EC 81 MG tablet, Take 81 mg by mouth daily., Disp: , Rfl:    losartan (COZAAR) 50 MG tablet, TAKE 1 TABLET DAILY, Disp: 90 tablet,  Rfl: 2   metFORMIN (GLUCOPHAGE-XR) 500 MG 24 hr tablet, Take 500 mg by mouth daily., Disp: , Rfl:    metoprolol tartrate (LOPRESSOR) 25 MG tablet, TAKE 1 TABLET EVERY        MORNING, AT NOON AND AT    BEDTIME, Disp: 270 tablet, Rfl: 2   nitroGLYCERIN (NITROSTAT) 0.4 MG SL tablet, PLACE 1 TABLET UNDER THE TONGUE AS NEEDED FOR CHEST PAIN, Disp: , Rfl: 1   triamterene-hydrochlorothiazide (MAXZIDE-25) 37.5-25 MG tablet, Take 1 tablet by mouth daily. , Disp: , Rfl:    Vitamin D, Ergocalciferol, (DRISDOL) 1.25 MG (50000 UNIT) CAPS capsule, Take 50,000 Units by mouth every 7 (seven) days., Disp: , Rfl:    VYTORIN 10-40 MG tablet, Take 0.5 tablets by mouth daily. , Disp: , Rfl:   Consent:   NA  Disposition:   1 year follow-up sooner if needed  His questions and concerns were addressed to his satisfaction. He voices understanding of the recommendations provided during this encounter.    Signed, Tessa Lerner, DO, [facility]  [facility]  [address] [place], [place] [address] 11/04/2023 1:28 PM

## 2023-11-06 ENCOUNTER — Other Ambulatory Visit: Payer: Self-pay | Admitting: Urology

## 2023-11-06 ENCOUNTER — Telehealth: Payer: Self-pay | Admitting: Cardiology

## 2023-11-06 NOTE — Telephone Encounter (Signed)
   Pre-operative Risk Assessment    Patient Name: [name]  DOB: 1930/09/25 MRN: [id number]      Request for Surgical Clearance    Procedure:   resection of bladder tumor  Date of Surgery:  Clearance 11/14/23                                 Surgeon:  Estrellita Ludwig Surgeon's Group or Practice Name:  [facility] Phone number:  [phone] Fax number:  [phone]   Type of Clearance Requested:   - Medical  - Pharmacy:  Hold Aspirin 5 days   Type of Anesthesia:  General    Additional requests/questions:   None  Signed, April L Harrington   11/06/2023, 4:34 PM

## 2023-11-07 ENCOUNTER — Encounter (HOSPITAL_COMMUNITY): Payer: Self-pay | Admitting: Urology

## 2023-11-07 NOTE — Progress Notes (Signed)
 COVID Vaccine Completed:  Yes  Date of COVID positive in last 90 days:  PCP - Jarome Matin, MD Cardiologist - Tessa Lerner, DO  Chest x-ray -  EKG - 11-04-23 Epic Stress Test -  ECHO - 03-13-23 Epic Cardiac Cath -  Pacemaker/ICD device last checked: Spinal Cord Stimulator: Long Term Monitor - 03-26-23 Epic  Bowel Prep -   Sleep Study -  CPAP -   Fasting Blood Sugar -  Checks Blood Sugar _____ times a day  Last dose of GLP1 agonist-  N/A GLP1 instructions:  Hold 7 days before surgery    Last dose of SGLT-2 inhibitors-  N/A SGLT-2 instructions:  Hold 3 days before surgery   Blood Thinner Instructions:  Last dose:   Time: Aspirin Instructions:  ASA 81 Last Dose:  Activity level:  Can go up a flight of stairs and perform activities of daily living without stopping and without symptoms of chest pain or shortness of breath.  Able to exercise without symptoms  Unable to go up a flight of stairs without symptoms of   Anesthesia review:  PVC,  NSVT, aortic valve insufficiency, HTN, DM  Patient denies shortness of breath, fever, cough and chest pain at PAT appointment  Patient verbalized understanding of instructions that were given to them at the PAT appointment. Patient was also instructed that they will need to review over the PAT instructions again at home before surgery.

## 2023-11-07 NOTE — Telephone Encounter (Signed)
 Patient is considered to be acceptable risk for upcoming procedure for resection of bladder tumor.  Yes he may hold aspirin for 5 days prior to the procedure.  Please make sure that his provider who does the procedure informs when he can restart aspirin postprocedure.  [name], DO, [facility]

## 2023-11-08 ENCOUNTER — Encounter (HOSPITAL_COMMUNITY): Payer: Self-pay | Admitting: Urology

## 2023-11-08 ENCOUNTER — Other Ambulatory Visit: Payer: Self-pay

## 2023-11-08 NOTE — Telephone Encounter (Signed)
     Primary Cardiologist: Tessa Lerner, DO  Chart reviewed as part of pre-operative protocol coverage. Given past medical history and time since last visit, based on ACC/AHA guidelines, ALFONZO ARCA would be at acceptable risk for the planned procedure without further cardiovascular testing.   Per Dr. Odis Hollingshead   "Patient is considered to be acceptable risk for upcoming procedure for resection of bladder tumor.   Yes he may hold aspirin for 5 days prior to the procedure.  Please make sure that his provider who does the procedure informs when he can restart aspirin postprocedure."  I will route this recommendation to the requesting party via Epic fax function and remove from pre-op pool.  Please call with questions.  Thomasene Ripple. Veneda Kirksey NP-C     11/08/2023, 11:07 AM [facility] [address] Office [phone] Fax [phone]

## 2023-11-11 NOTE — Progress Notes (Signed)
 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 former smoker with h/o HTN, DM II, PVCs, mild to moderate aortic regurgitation (clinically asymptomatic), bladder tumor scheduled for above procedure 11/14/2023 with Dr. Kasandra Knudsen.   Per cardiology preoperative evaluation 11/08/2023, "Chart reviewed as part of pre-operative protocol coverage. Given past medical history and time since last visit, based on ACC/AHA guidelines, BULMARO FEAGANS would be at acceptable risk for the planned procedure without further cardiovascular testing.    Per Dr. Odis Hollingshead    "Patient is considered to be acceptable risk for upcoming procedure for resection of bladder tumor.   Yes he may hold aspirin for 5 days prior to the procedure.  Please make sure that his provider who does the procedure informs when he can restart aspirin postprocedure.""  VS: There were no vitals taken for this visit.  PROVIDERS: Garlan Fillers, MD is PCP   Primary Cardiologist: Tessa Lerner, DO  LABS:  labs DOS, same day workup, not seen in person  (all labs ordered are listed, but only abnormal results are displayed)  Labs Reviewed - No data to display   IMAGES:   EKG:   CV:  Echocardiogram 03/13/2023:  Left ventricle cavity is normal in size. Moderate concentric hypertrophy  of the left ventricle. Normal global wall motion. Normal LV systolic  function with EF 62%. Normal diastolic filling pattern.  Trileaflet aortic valve. Mild aortic valve leaflet calcification. Mild to  moderate aortic regurgitation.   Mild calcification of the mitral valve annulus. Trace mitral  regurgitation.  Mild tricuspid regurgitation.  No evidence of pulmonary hypertension.  No significant change compared to previous study on 03/29/2021.   Echo 07/11/2022 1. Left ventricular ejection fraction, by estimation, is 55 to 60%. The  left ventricle has normal function. The left ventricle has no regional  wall motion abnormalities. Left ventricular diastolic parameters were  normal.   2. Right ventricular systolic function is normal. The right ventricular  size is normal. There is normal pulmonary artery systolic pressure. The  estimated right ventricular systolic pressure is 17.0 mmHg.   3. The mitral valve is normal in structure. Trivial mitral valve  regurgitation.   4. Mild calcification of the TV apparatus.   5. The aortic valve is tricuspid. Aortic valve regurgitation is mild to  moderate. Aortic valve sclerosis/calcification is present, without any  evidence of aortic stenosis. Aortic valve mean gradient measures 6.0 mmHg.   Lexiscan Tetrofosmin stress test 03/05/2022: Lexiscan nuclear stress test performed using *-day protocol. Decreased tracer uptake in inferior myocardium seen on short axis rest and stress images likely due to subdiaphragmatic attenuation. No definite evidence of ischemia.  Stress LVEF 52%. Low risk study.  Past Medical History:  Diagnosis Date   Anemia    Aortic regurgitation    Arthritis    Diabetes mellitus without complication (HCC)    HOH (hard of hearing)    Hypertension    Premature ventricular contractions     Past Surgical History:  Procedure Laterality Date   BACK SURGERY     x3   CATARACT EXTRACTION W/  INTRAOCULAR LENS IMPLANT     COLONOSCOPY     ESOPHAGOGASTRODUODENOSCOPY N/A 11/30/2015   Procedure: ESOPHAGOGASTRODUODENOSCOPY (EGD);  Surgeon: Dorena Cookey, MD;  Location: [facility];  Service: Endoscopy;  Laterality: N/A;    MEDICATIONS: No current  facility-administered medications for this encounter.    acetaminophen (TYLENOL) 500 MG tablet   amLODipine (NORVASC) 5 MG tablet   aspirin EC 81 MG tablet   losartan (COZAAR) 50 MG tablet   metFORMIN (GLUCOPHAGE-XR) 500 MG 24 hr tablet   metoprolol tartrate (LOPRESSOR) 25 MG tablet   nitroGLYCERIN (NITROSTAT) 0.4 MG SL tablet   triamterene-hydrochlorothiazide (MAXZIDE-25) 37.5-25 MG tablet   Vitamin D, Ergocalciferol, (DRISDOL) 1.25 MG (50000 UNIT) CAPS capsule   VYTORIN 10-40 MG tablet     [facility], PA-C [facility] Pre-Surgical Testing [phone]

## 2023-11-14 ENCOUNTER — Encounter (HOSPITAL_COMMUNITY): Payer: Self-pay | Admitting: Physician Assistant

## 2023-11-14 ENCOUNTER — Ambulatory Visit (HOSPITAL_COMMUNITY): Admission: RE | Admit: 2023-11-14 | Source: Ambulatory Visit | Admitting: Urology

## 2023-11-14 DIAGNOSIS — I10 Essential (primary) hypertension: Secondary | ICD-10-CM

## 2023-11-14 DIAGNOSIS — E119 Type 2 diabetes mellitus without complications: Secondary | ICD-10-CM

## 2023-11-14 DIAGNOSIS — D649 Anemia, unspecified: Secondary | ICD-10-CM

## 2023-11-14 HISTORY — DX: Unspecified osteoarthritis, unspecified site: M19.90

## 2023-11-14 HISTORY — DX: Anemia, unspecified: D64.9

## 2023-11-14 SURGERY — TURBT, WITH CHEMOTHERAPEUTIC AGENT INSTILLATION INTO BLADDER
Anesthesia: General

## 2024-03-14 ENCOUNTER — Other Ambulatory Visit: Payer: Self-pay | Admitting: Cardiology

## 2024-03-14 DIAGNOSIS — I493 Ventricular premature depolarization: Secondary | ICD-10-CM

## 2024-03-14 DIAGNOSIS — I4729 Other ventricular tachycardia: Secondary | ICD-10-CM

## 2024-03-16 ENCOUNTER — Telehealth: Payer: Self-pay | Admitting: Cardiology

## 2024-03-16 ENCOUNTER — Other Ambulatory Visit: Payer: Self-pay | Admitting: Urology

## 2024-03-16 NOTE — Telephone Encounter (Signed)
     Pre-operative Risk Assessment    Patient Name: [name]  DOB: 1931-05-13 MRN: [id number]   Date of last office visit: 11/04/23 Date of next office visit: 10/2024 (recall)   Request for Surgical Clearance    Procedure:  Transurethral resection of bladder tumor and intravesical gemcitabine   Date of Surgery:  Clearance 04/14/24                                Surgeon:  Dr. Ronal Leeroy Shank Surgeon's Group or Practice Name:  [facility] Phone number:  [phone] Fax number:  [phone]   Type of Clearance Requested:   - Medical  - Pharmacy:  Hold Aspirin 5 days prior   Type of Anesthesia:  General    Additional requests/questions:    Signed, Deeanna GORMAN Frees   03/16/2024, 12:02 PM

## 2024-03-16 NOTE — Telephone Encounter (Signed)
 Primary Cardiologist:Sunit Tolia, DO   Preoperative team, please contact this patient and set up a phone call appointment for further preoperative risk assessment. Please obtain consent and complete medication review. Thank you for your help.   I confirm that guidance regarding antiplatelet and oral anticoagulation therapy has been completed and, if necessary, noted below.  Patient previously cleared in note from 11/06/23. Given that it has been > 2 months, we will schedule a telephone visit at which time clearance for holding asa can be addressed.   I also confirmed the patient resides in the state of [place] . As per [facility] Medical Board telemedicine laws, the patient must reside in the state in which the provider is licensed.   Danny EMERSON Bane, NP-C  03/16/2024, 3:55 PM [address], [address] [place], [place] [address] Office [phone] Fax [phone]

## 2024-03-16 NOTE — Telephone Encounter (Signed)
 Left message to call back to schedule tele pre op appt.

## 2024-03-17 NOTE — Telephone Encounter (Signed)
 2nd attempt to reach pt regarding surgical clearance and the need for an TELE appointment.  Left pt a detailed message to call back and get that scheduled.

## 2024-03-18 ENCOUNTER — Telehealth: Payer: Self-pay

## 2024-03-18 NOTE — Telephone Encounter (Signed)
 Patient has been scheduled for his televisit and consent is done     Patient Consent for Virtual Visit         [name] has provided verbal consent on 03/18/2024 for a virtual visit (video or telephone).   CONSENT FOR VIRTUAL VISIT FOR:  [name]  By participating in this virtual visit I agree to the following:  I hereby voluntarily request, consent and authorize [facility] and its employed or contracted physicians, physician assistants, nurse practitioners or other licensed health care professionals (the Practitioner), to provide me with telemedicine health care services (the "Services) as deemed necessary by the treating Practitioner. I acknowledge and consent to receive the Services by the Practitioner via telemedicine. I understand that the telemedicine visit will involve communicating with the Practitioner through live audiovisual communication technology and the disclosure of certain medical information by electronic transmission. I acknowledge that I have been given the opportunity to request an in-person assessment or other available alternative prior to the telemedicine visit and am voluntarily participating in the telemedicine visit.  I understand that I have the right to withhold or withdraw my consent to the use of telemedicine in the course of my care at any time, without affecting my right to future care or treatment, and that the Practitioner or I may terminate the telemedicine visit at any time. I understand that I have the right to inspect all information obtained and/or recorded in the course of the telemedicine visit and may receive copies of available information for a reasonable fee.  I understand that some of the potential risks of receiving the Services via telemedicine include:  Delay or interruption in medical evaluation due to technological equipment failure or disruption; Information transmitted may not be sufficient (e.g. poor resolution of images) to  allow for appropriate medical decision making by the Practitioner; and/or  In rare instances, security protocols could fail, causing a breach of personal health information.  Furthermore, I acknowledge that it is my responsibility to provide information about my medical history, conditions and care that is complete and accurate to the best of my ability. I acknowledge that Practitioner's advice, recommendations, and/or decision may be based on factors not within their control, such as incomplete or inaccurate data provided by me or distortions of diagnostic images or specimens that may result from electronic transmissions. I understand that the practice of medicine is not an exact science and that Practitioner makes no warranties or guarantees regarding treatment outcomes. I acknowledge that a copy of this consent can be made available to me via my patient portal [facility] MyChart), or I can request a printed copy by calling the office of [facility].    I understand that my insurance will be billed for this visit.   I have read or had this consent read to me. I understand the contents of this consent, which adequately explains the benefits and risks of the Services being provided via telemedicine.  I have been provided ample opportunity to ask questions regarding this consent and the Services and have had my questions answered to my satisfaction. I give my informed consent for the services to be provided through the use of telemedicine in my medical care

## 2024-03-18 NOTE — Telephone Encounter (Signed)
Patient has been scheduled for televisit.

## 2024-03-25 ENCOUNTER — Ambulatory Visit: Attending: Cardiology | Admitting: Student

## 2024-03-25 DIAGNOSIS — Z01810 Encounter for preprocedural cardiovascular examination: Secondary | ICD-10-CM

## 2024-03-25 NOTE — Progress Notes (Signed)
 Virtual Visit via Telephone Note   Because of [name] co-morbid illnesses, he is at least at moderate risk for complications without adequate follow up.  This format is felt to be most appropriate for this patient at this time.  The patient did not have access to video technology/had technical difficulties with video requiring transitioning to audio format only (telephone).  All issues noted in this document were discussed and addressed.  No physical exam could be performed with this format.  Please refer to the patient's chart for his consent to telehealth for [facility].  Evaluation Performed:  Preoperative cardiovascular risk assessment _____________   Date:  03/25/2024   Patient ID:  [name], DOB 02-Nov-1930, MRN [id number] Patient Location:  Home Provider location:   Office  Primary Care Provider:  Yolande Toribio MATSU, MD Primary Cardiologist:  Madonna Large, DO  Chief Complaint / Patient Profile   89 y.o. y/o male with a h/o PVCs, aortic insufficiency, hyperlipidemia, hypertension, T2DM who is pending transurethral resection of bladder tumor and intravesical gemcitabine by Dr. Elisabeth and presents today for telephonic preoperative cardiovascular risk assessment.  History of Present Illness    [name] is a 89 y.o. male who presents via audio/video conferencing for a telehealth visit today.  Pt was last seen in cardiology clinic on 11/04/2023 by Dr. Large.  At that time [name] was stable from a cardiac standpoint.  The patient is now pending procedure as outlined above. Since his last visit, he is doing well. Patient denies shortness of breath, dyspnea on exertion, lower extremity edema, orthopnea or PND. No chest pain, pressure, or tightness. No palpitations.  He does not participate in formal exercise. He is independent with ADLs and performs light to moderate household activities and yard work.   Past Medical History    Past Medical History:  Diagnosis  Date   Anemia    Aortic regurgitation    Arthritis    Diabetes mellitus without complication (HCC)    HOH (hard of hearing)    Hypertension    Premature ventricular contractions    Past Surgical History:  Procedure Laterality Date   BACK SURGERY     x3   CATARACT EXTRACTION W/ INTRAOCULAR LENS IMPLANT     COLONOSCOPY     ESOPHAGOGASTRODUODENOSCOPY N/A 11/30/2015   Procedure: ESOPHAGOGASTRODUODENOSCOPY (EGD);  Surgeon: Norleen Hint, MD;  Location: [facility];  Service: Endoscopy;  Laterality: N/A;    Allergies  Allergies  Allergen Reactions   Atorvastatin     myalgias    Home Medications    Prior to Admission medications   Medication Sig Start Date End Date Taking? Authorizing Provider  acetaminophen  (TYLENOL ) 500 MG tablet Take 500-1,000 mg by mouth daily. Additional 1000 mg at bedtime as needed for pain    [provider]  amLODipine  (NORVASC ) 5 MG tablet TAKE 1 TABLET DAILY 07/31/23   Tolia, Sunit, DO  aspirin EC 81 MG tablet Take 81 mg by mouth daily.    [provider]  losartan  (COZAAR ) 50 MG tablet TAKE 1 TABLET DAILY 07/31/23   Tolia, Sunit, DO  metFORMIN (GLUCOPHAGE-XR) 500 MG 24 hr tablet Take 500 mg by mouth daily. 10/03/21   [provider]  metoprolol  tartrate (LOPRESSOR ) 25 MG tablet TAKE 1 TABLET EVERY        MORNING, AT NOON AND AT    BEDTIME 03/16/24   Tolia, Sunit, DO  nitroGLYCERIN (NITROSTAT) 0.4 MG SL tablet PLACE 1  TABLET UNDER THE TONGUE AS NEEDED FOR CHEST PAIN Patient not taking: Reported on 11/12/2023 11/03/15   [provider]  triamterene-hydrochlorothiazide (MAXZIDE-25) 37.5-25 MG tablet Take 1 tablet by mouth daily.     [provider]  Vitamin D, Ergocalciferol, (DRISDOL) 1.25 MG (50000 UNIT) CAPS capsule Take 50,000 Units by mouth every 7 (seven) days.    [provider]  VYTORIN  10-40 MG tablet Take 0.5 tablets by mouth daily.  09/12/15   [provider]    Physical Exam    Vital Signs:   JERARD BAYS does not have vital signs available for review today.  Given telephonic nature of communication, physical exam is limited. AAOx3. NAD. Normal affect.  Speech and respirations are unlabored.   Assessment & Plan    Primary Cardiologist: Madonna Large, DO  Preoperative cardiovascular risk assessment.  Transurethral resection of bladder tumor and intravesical gemcitabine by Dr. Elisabeth on 04/14/2024.  Chart reviewed as part of pre-operative protocol coverage. According to the RCRI, patient has a 0.4% risk of MACE. Patient reports activity equivalent to 5.62 METS (per [facility]ASI).   Given past medical history and time since last visit, based on ACC/AHA guidelines, [name] would be at acceptable risk for the planned procedure without further cardiovascular testing.   Patient was advised that if he develops new symptoms prior to surgery to contact our office to arrange a follow-up appointment.  he verbalized understanding.  Ideally aspirin should be continued without interruption, however if the bleeding risk is too great, aspirin may be held for 5-7 days prior to surgery. Please resume aspirin post operatively when it is felt to be safe from a bleeding standpoint.    I will route this recommendation to the requesting party via Epic fax function.  Please call with questions.  Time:   Today, I have spent 7 minutes with the patient with telehealth technology discussing medical history, symptoms, and management plan.     Barnie Hila, NP  03/25/2024, 8:01 AM

## 2024-04-01 ENCOUNTER — Encounter (HOSPITAL_COMMUNITY): Admission: RE | Admit: 2024-04-01 | Source: Ambulatory Visit

## 2024-04-14 ENCOUNTER — Ambulatory Visit (HOSPITAL_COMMUNITY): Admit: 2024-04-14 | Admitting: Urology

## 2024-04-14 SURGERY — TURBT, WITH CHEMOTHERAPEUTIC AGENT INSTILLATION INTO BLADDER
Anesthesia: General

## 2024-06-13 ENCOUNTER — Other Ambulatory Visit: Payer: Self-pay | Admitting: Cardiology
